# Patient Record
Sex: Male | Born: 1960
Health system: Southern US, Community
[De-identification: ages and names within clinical notes are randomized; demographics above are authoritative.]

## PROBLEM LIST (undated history)

## (undated) DIAGNOSIS — Z8719 Personal history of other diseases of the digestive system: Secondary | ICD-10-CM

## (undated) DIAGNOSIS — K635 Polyp of colon: Secondary | ICD-10-CM

## (undated) DIAGNOSIS — C801 Malignant (primary) neoplasm, unspecified: Secondary | ICD-10-CM

## (undated) DIAGNOSIS — F419 Anxiety disorder, unspecified: Secondary | ICD-10-CM

## (undated) DIAGNOSIS — K219 Gastro-esophageal reflux disease without esophagitis: Secondary | ICD-10-CM

## (undated) DIAGNOSIS — E559 Vitamin D deficiency, unspecified: Secondary | ICD-10-CM

## (undated) DIAGNOSIS — M2550 Pain in unspecified joint: Secondary | ICD-10-CM

## (undated) DIAGNOSIS — J189 Pneumonia, unspecified organism: Secondary | ICD-10-CM

## (undated) DIAGNOSIS — M549 Dorsalgia, unspecified: Secondary | ICD-10-CM

## (undated) HISTORY — DX: Polyp of colon: K63.5

## (undated) HISTORY — DX: Anxiety disorder, unspecified: F41.9

## (undated) HISTORY — DX: Malignant (primary) neoplasm, unspecified: C80.1

## (undated) HISTORY — DX: Pain in unspecified joint: M25.50

## (undated) HISTORY — DX: Vitamin D deficiency, unspecified: E55.9

## (undated) HISTORY — DX: Gastro-esophageal reflux disease without esophagitis: K21.9

## (undated) HISTORY — DX: Dorsalgia, unspecified: M54.9

---

## 1992-11-24 HISTORY — PX: VASECTOMY: SHX75

## 2001-10-29 ENCOUNTER — Encounter (INDEPENDENT_AMBULATORY_CARE_PROVIDER_SITE_OTHER): Payer: Self-pay | Admitting: Specialist

## 2001-10-29 ENCOUNTER — Ambulatory Visit (HOSPITAL_BASED_OUTPATIENT_CLINIC_OR_DEPARTMENT_OTHER): Admission: RE | Admit: 2001-10-29 | Discharge: 2001-10-29 | Payer: Self-pay

## 2004-11-24 DIAGNOSIS — K635 Polyp of colon: Secondary | ICD-10-CM

## 2004-11-24 HISTORY — DX: Polyp of colon: K63.5

## 2005-07-29 ENCOUNTER — Ambulatory Visit: Payer: Self-pay | Admitting: Internal Medicine

## 2005-08-01 ENCOUNTER — Ambulatory Visit: Payer: Self-pay | Admitting: Gastroenterology

## 2005-08-01 ENCOUNTER — Encounter (INDEPENDENT_AMBULATORY_CARE_PROVIDER_SITE_OTHER): Payer: Self-pay | Admitting: Specialist

## 2006-08-28 ENCOUNTER — Ambulatory Visit: Payer: Self-pay | Admitting: Gastroenterology

## 2006-11-24 HISTORY — PX: HERNIA REPAIR: SHX51

## 2007-02-11 ENCOUNTER — Ambulatory Visit (HOSPITAL_COMMUNITY): Admission: RE | Admit: 2007-02-11 | Discharge: 2007-02-12 | Payer: Self-pay | Admitting: Surgery

## 2008-03-15 ENCOUNTER — Ambulatory Visit: Payer: Self-pay | Admitting: Gastroenterology

## 2008-03-30 ENCOUNTER — Encounter: Admission: RE | Admit: 2008-03-30 | Discharge: 2008-03-30 | Payer: Self-pay | Admitting: Surgery

## 2008-07-24 ENCOUNTER — Telehealth: Payer: Self-pay | Admitting: Gastroenterology

## 2010-06-28 ENCOUNTER — Encounter: Payer: Self-pay | Admitting: Gastroenterology

## 2010-11-05 ENCOUNTER — Emergency Department (HOSPITAL_COMMUNITY)
Admission: EM | Admit: 2010-11-05 | Discharge: 2010-11-05 | Payer: Self-pay | Source: Home / Self Care | Admitting: Emergency Medicine

## 2010-12-24 NOTE — Letter (Signed)
Summary: Colonoscopy Letter  [facility]  [address] [place], [place] [address]   Phone: [phone]  Fax: [phone]      June 28, 2010 MRN: [id number]   [facility] [address] [place], [place]  [address]   Dear Mr. GOUGH,   According to your medical record, it is time for you to schedule a Colonoscopy. The American Cancer Society recommends this procedure as a method to detect early colon cancer. Patients with a family history of colon cancer, or a personal history of colon polyps or inflammatory bowel disease are at increased risk.  This letter has been generated based on the recommendations made at the time of your procedure. If you feel that in your particular situation this may no longer apply, please contact our office.  Please call our office at [phone] to schedule this appointment or to update your records at your earliest convenience.  Thank you for cooperating with [place] to provide you with the very best care possible.   Sincerely,  Rachael Fee, M.D.  [facility] [phone]

## 2011-04-08 NOTE — Assessment & Plan Note (Signed)
[facility]                         GASTROENTEROLOGY OFFICE NOTE   DELOSS, AMICO                     MRN:          [id number]  DATE:03/15/2008                            DOB:          29-Mar-1961    GI PROBLEM LIST:  1. Gastroesophageal reflux disease.  Well-controlled when he takes his      PPI daily.  Seems to be sporadic about this.  He has also noticed      alcoholic intake plays a great role.  EGD, September 2006, showed      mild erosive esophagitis LA class A, a 2 cm hiatal hernia,      otherwise normal exam.  No Barrett's esophagus.  2. Family history for colon cancer.  Colonoscopy August 02, 2005,      was normal except for a small rectal hyperplastic polyp.  Next      colonoscopy due September 2011.   INTERVAL HISTORY:  I last saw Avelardo about a year and a half ago.  Since then he has gained about 10 to 11 pounds.  He ran out of his  Protonix several months ago, and since then he has noticed return of his  intermittent pyrosis.  He has no dysphagia, no weight loss, no vomiting  of blood and no otherwise overt GI bleeding.  He is worried that  Protonix may have been contributing to some of his new knee pains and  shoulder pains.   CURRENT MEDICATIONS:  Protonix, which he ran out of several months ago.   PHYSICAL EXAMINATION:  Weight 247 pounds, which is up 11 pounds since  his last visit.  Blood pressure 114/82, pulse 86.  CONSTITUTIONAL:  Generally well appearing.  ABDOMEN:  Soft, nontender, nondistended.  Normal bowel sounds.  EXTREMITIES:  No lower extremity edema.   ASSESSMENT/PLAN:  50 year old man with gastroesophageal reflux  disease.   He has no alarm symptoms, simply ran out of his Protonix.  He is not  sure that he will have prescription coverage soon, and so rather than  call him in a prescription refill for Protonix, I will call a new  prescription for Kapidex, which is a new proton pump inhibitor, that  has  very good drug company benefits these days.  He knows to get back in  touch with me if he has any further questions or concerns.  I also told  him that it was very unlikely that any proton pump inhibitor would be  causing or contributing to his joint pains, and more likely, his steady  weight gain over the past several years is the cause.     Rachael Fee, MD  Electronically Signed    DPJ/MedQ  DD: 03/15/2008  DT: 03/15/2008  Job #: [id number]

## 2011-04-11 NOTE — Op Note (Signed)
[place]  Patient:    Steven Zimmerman, POCH Visit Number: [id number] MRN: [id number]          Service Type: DSU Location: [facility] Attending Physician:  Meredith Leeds Dictated by:   Zigmund Daniel, M.D. Proc. Date: 12/30/01 Admit Date:  10/29/2001 Discharge Date: 10/29/2001                             Operative Report  PREOPERATIVE DIAGNOSIS: 1. Ganglion cyst, left wrist. 2. Subcutaneous tumors of the abdominal wall and back.  POSTOPERATIVE DIAGNOSIS: 1. Ganglion cyst, left wrist. 2. Subcutaneous tumors of the abdominal wall and back.  OPERATION PERFORMED: 1. Excision of ganglion cyst, left wrist. 2. Excision of abdominal wall tumors of abdomen and back (lipomas).  SURGEON:  Zigmund Daniel, M.D.  ANESTHESIA:  General.  DESCRIPTION OF PROCEDURE:  After the patient had adequate general anesthesia and routine preparation and draping of the abdomen and left wrist, I first made a longitudinal incision on the wrist and dissected down to the mass which was cystic and filled with clear fluid.  I dissected on down and found it did not connect with the radial artery, but rather, with the joint and I totally excised it.  I closed the skin with intracuticular 4-0 Vicryl and Steri-Strips after getting good hemostasis.  I then made separate incisions over the subcutaneous mass on the epigastrium and dissected down to it, found it to be a typical lipoma a couple of centimeters in size, and excised it entirely, got hemostasis and did a similar skin closure.  I then turned the patient over prone and excised two lipomas of the back through separate incisions, closing the skin similarly with intracuticular 4-0 Vicryl and Steri-Strips after good hemostasis.  The ganglion cyst was slightly atypical in appearance and I sent it for histological examination.  The lipomas were very typical and soft after removal and they were discarded.  The patient  tolerated the procedure well and woke up without complications. Dictated by:   Zigmund Daniel, M.D. Attending Physician:  Meredith Leeds DD:  12/30/01 TD:  12/31/01 Job: [id number]

## 2011-04-11 NOTE — Op Note (Signed)
Steven Zimmerman, Steven Zimmerman              ACCOUNT NO.:  [id number]   MEDICAL RECORD NO.:  [id number]          PATIENT TYPE:  AMB   LOCATION:  DAY                          FACILITY:  [facility] [place]   PHYSICIAN:  Thomas A. Cornett, M.D.DATE OF BIRTH:  November 08, 1961   DATE OF PROCEDURE:  02/11/2007  DATE OF DISCHARGE:                               OPERATIVE REPORT   PREOPERATIVE DIAGNOSIS:  Periumbilical ventral hernia.   POSTOPERATIVE DIAGNOSIS:  Periumbilical ventral hernia.   PROCEDURE:  Laparoscopic ventral hernia repair with mesh.   SURGEON:  Maisie Fus A. Cornett, M.D.   ANESTHESIA:  General endotracheal anesthesia with approximately 20 mL of  0.25% Sensorcaine local.   ESTIMATED BLOOD LOSS:  20 mL.   SPECIMENS:  None.   INDICATIONS FOR PROCEDURE:  The patient is a 50 year old male who was  sent due to a painful ventral hernia.  He had pain around his  periumbilical region and a bulge there.  He also had some weakness just  to the left of this.  He was brought to the operating room today for  repair of ventral hernia which was symptomatic.  Informed consent was  obtained from the patient.   DESCRIPTION OF PROCEDURE:  The patient was brought to the operating room  and placed supine.  A Foley catheter was placed.  After induction of  appropriate general anesthesia, the abdomen was prepped and draped in  sterile fashion.  He received preop antibiotics.  I elected to use a 5  mm OptiView port to gain access.  A small incision was made in his left  upper quadrant.  With the OptiView under direct vision, I was able to  guide this through the subcu fat, through the muscle layers into the  peritoneal lining under direct vision to enter the abdominal cavity  without difficulty or evidence of injury to bowel.  Insufflation was  obtained to 15 mmHg of CO2.  I was then able to place easily in the  abdominal cavity.  I saw no evidence of any injury to bowel or other  organ from insertion of this trocar.   Next I placed another 5 mm port in  the left lower quadrant.  I placed in the right upper quadrant an 11 mm  port and in the right lower quadrant a 5 mm port, all under direct  vision.  Upon examination of the abdominal cavity, there was a defect at  his umbilicus.  There was also some weakening just left of this, which  was not a full-blown hernia but was quite significant.  There were no  contents caught up in the hernia.  The remainder of his laparoscopy was  normal.  I was able to push down some preperitoneal fat back out of the  hernia and I used the cautery to trim this back and excise it out of the  hernia defects since much of his pain is from this fat bulge.  I used an  Multimedia programmer, though, since this was a significant amount of fatty  tissue that I had to excise from the hernia itself and extracted it  through the right upper quadrant port with an EndoCatch bag.  I then  reinserted this point without difficulty.  Once this was done I measured  out the defect, which in the periumbilical region.  The area I measured  was roughly 8 x 8 cm.  I elected to use 4 cm of overlap and chose a 12 x  12 cm round Parietex dual mesh with the protective coat on the inside.  I then placed four anchoring sutures for this.  This was then soaked in  saline.  This was then inserted through the 11 mm trocar and spread out.  I then used a suture passer to grasp all four sutures and pull these up  to the abdominal wall.  These were tied down.  The superior suture,  though, had broken, and I had to reinsert this using a suture passer  through the mesh and into the abdominal wall and resecured this.  A  ProTack was then used to secure the mesh circumferentially to the  peritoneal lining with no gaps.  It laid with minimal pleating and  covered the defect widely with adequate overlap.  I then reinspected the  abdominal cavity and saw no evidence of bleeding.  I also inspected the  small bowel and colon  as well as liver, stomach and gallbladder, and saw  no evidence of injury or abnormality at this point in time.  I released  the CO2, then reinflated the abdominal cavity and reinspected the  visceral contents and saw no evidence of injury at this point in time.  I then removed the 11-mm port and closed this using a suture passer and  a 0 Vicryl to close the fascial defect.  The remainder of the ports were  then opened to allow the CO2 to escape.  These were all removed then.  I  then closed the skin incisions with 4-0 Monocryl.  I used Dermabond as a  dressing.  All final counts of sponge, needle and instruments were found  to be correct at this portion of the case.  The patient was then awoken,  taken to recovery in satisfactory condition.      Thomas A. Cornett, M.D.  Electronically Signed     TAC/MEDQ  D:  02/11/2007  T:  02/11/2007  Job:  [id number]

## 2011-04-11 NOTE — Assessment & Plan Note (Signed)
[facility]                           GASTROENTEROLOGY OFFICE NOTE   JSAON, YOO                     MRN:          [id number]  DATE:08/28/2006                            DOB:          [birth year]    GI PROBLEM LIST:  1. Gastroesophageal reflux disease.  Well-controlled when he takes his PPI      daily.  Seems to be sporadic about this.  He has also noticed alcoholic      intake plays a great role.  EGD, September 2006, showed mild erosive      esophagitis LA class A, a 2 cm hiatal hernia, otherwise normal exam.      No Barrett's esophagus.  2. Family history for colon cancer.  Colonoscopy August 02, 2005, was      normal except for a small rectal hyperplastic polyp.  Next colonoscopy      due September 2011.   INTERVAL HISTORY:  I last saw Salem about a year ago.  Since then, he has  gained about 15 pounds.  He initially stopped drinking but is back drinking  on kind of a binge basis.  He says a lot of this is because he is having  real family difficulties with a daughter who ran away and has now returned  and is pregnant at a very young age.  He says when stressful times like this  hit, he eats a lot more.  Protonix has been working for him and he is out of  his prescription.   CURRENT MEDICINES:  Protonix daily (he has been out for approximately 1  month).   PHYSICAL EXAM:  Weight 236 pounds.  Blood pressure 132/84.  Pulse 80.  CONSTITUTIONAL:  Obese, otherwise well-appearing.  NEUROLOGIC:  Alert and oriented x3.  ABDOMEN:  Soft, nontender, nondistended.  Normal bowel sounds.   ASSESSMENT AND PLAN:  A 50 year old man with gastroesophageal reflux  disease, family history for colon cancer.  I wrote him a prescription for  Protonix 40 mg to be taken once daily, 20 to 30 minutes prior to a meal.  I  advised him to try taking OTC Prilosec first to see if this helps his  symptoms.  If that does help, he will tear up his Protonix  prescription.  He  knows that many lifestyle factors play in gastroesophageal reflux disease  and will do his best to try to alter those, especially his weight and his  alcohol intake.            ______________________________  Rachael Fee, MD      DPJ/MedQ  DD:  08/28/2006  DT:  08/31/2006  Job #:  [id number]

## 2011-05-22 ENCOUNTER — Encounter: Payer: Self-pay | Admitting: Internal Medicine

## 2011-05-22 ENCOUNTER — Ambulatory Visit (INDEPENDENT_AMBULATORY_CARE_PROVIDER_SITE_OTHER): Payer: Managed Care, Other (non HMO) | Admitting: Internal Medicine

## 2011-05-22 DIAGNOSIS — R490 Dysphonia: Secondary | ICD-10-CM | POA: Insufficient documentation

## 2011-05-22 DIAGNOSIS — F419 Anxiety disorder, unspecified: Secondary | ICD-10-CM

## 2011-05-22 DIAGNOSIS — F411 Generalized anxiety disorder: Secondary | ICD-10-CM

## 2011-05-22 DIAGNOSIS — K219 Gastro-esophageal reflux disease without esophagitis: Secondary | ICD-10-CM | POA: Insufficient documentation

## 2011-05-22 DIAGNOSIS — Z0000 Encounter for general adult medical examination without abnormal findings: Secondary | ICD-10-CM | POA: Insufficient documentation

## 2011-05-22 DIAGNOSIS — N529 Male erectile dysfunction, unspecified: Secondary | ICD-10-CM | POA: Insufficient documentation

## 2011-05-22 LAB — COMPREHENSIVE METABOLIC PANEL
ALT: 25 U/L (ref 0–53)
AST: 20 U/L (ref 0–37)
Albumin: 3.8 g/dL (ref 3.5–5.2)
BUN: 18 mg/dL (ref 6–23)
Chloride: 103 mEq/L (ref 96–112)
Creatinine, Ser: 0.9 mg/dL (ref 0.4–1.5)
Glucose, Bld: 86 mg/dL (ref 70–99)
Total Bilirubin: 0.8 mg/dL (ref 0.3–1.2)
Total Protein: 6.6 g/dL (ref 6.0–8.3)

## 2011-05-22 LAB — CBC WITH DIFFERENTIAL/PLATELET
Basophils Absolute: 0 10*3/uL (ref 0.0–0.1)
Basophils Relative: 0.6 % (ref 0.0–3.0)
Eosinophils Relative: 4.8 % (ref 0.0–5.0)
Hemoglobin: 14.8 g/dL (ref 13.0–17.0)
MCHC: 34.6 g/dL (ref 30.0–36.0)
Monocytes Absolute: 0.6 10*3/uL (ref 0.1–1.0)
Monocytes Relative: 7.8 % (ref 3.0–12.0)
Neutro Abs: 5.1 10*3/uL (ref 1.4–7.7)
Platelets: 171 10*3/uL (ref 150.0–400.0)
WBC: 7.7 10*3/uL (ref 4.5–10.5)

## 2011-05-22 LAB — LIPID PANEL: Total CHOL/HDL Ratio: 4

## 2011-05-22 MED ORDER — PANTOPRAZOLE SODIUM 40 MG PO TBEC: 40.0000 mg | DELAYED_RELEASE_TABLET | Freq: Every day | ORAL | Status: DC

## 2011-05-22 NOTE — Progress Notes (Signed)
  Subjective:    Patient ID: Steven Zimmerman, male    DOB: Sep 22, 1961, 50 y.o.   MRN: [id number]  HPI New patient, has several issues. 2 year history of anxiety, moody, wife believes he needs his testosterone checked.  Weight gain for a while. Few weeks h/o raspy voice Wonders if  he is due for a colonoscopy   Past Medical History  Diagnosis Date  . GERD (gastroesophageal reflux disease)   . Anxiety     Past Surgical History  Procedure Date  . Vasectomy 1994  . Hernia repair 2008    Family History  Problem Relation Age of Onset  . Colon cancer Mother   . Esophageal cancer Father   . Prostate cancer Neg Hx   . Coronary artery disease Neg Hx     History   Social History  . Marital Status: Married    Spouse Name: N/A    Number of Children: 3  . Years of Education: N/A   Occupational History  . 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 Smoking status: Former Games developer  . Smokeless tobacco: Not on file  . Alcohol Use: Yes     no daily but heavy (1 or 2 cases during the weekend)  . Drug Use: Not on file  . Sexually Active: Not on file   Other Topics Concern  . Not on file   Social History Narrative  . No narrative on file      Review of Systems Denies suicidal ideas, never had counseling or take any SSRIs medications. Admits to some problems with difficulty directions, libido is normal. Admits to some stress, works third shift, able to sleep only 4 or 6 hours whenever he leaves work Used to be on protonix, d/c 2 years ago b/c he felt better  better, sx slt worse lately S/p surgery for a hernia repair (umbilical) area still bulge, no pain     Objective:   Physical Exam  Constitutional: He is oriented to person, place, and time. He appears well-developed and well-nourished.  HENT:  Head: Normocephalic and atraumatic.  Neck: No thyromegaly present.  Cardiovascular: Normal rate, regular rhythm and normal heart sounds.   No murmur  heard. Pulmonary/Chest: Breath sounds normal. No respiratory distress. He has no wheezes. He has no rales.  Abdominal: He exhibits no distension. There is no tenderness. There is no rebound.       Soft superficial mass, reducible? Located immediately above  The  umbilicus  Musculoskeletal: He exhibits no edema.  Neurological: He is alert and oriented to person, place, and time.  Psychiatric:       Slightly anxious          Assessment & Plan:

## 2011-05-22 NOTE — Assessment & Plan Note (Addendum)
Patient has  Hoarseness x few weeks, he used to smoke, drinks ETOH. Sx may be due to GERD Refer  to ENT discussed, pt hesitant, will refer if sx persist after GERD treatment

## 2011-05-22 NOTE — Assessment & Plan Note (Addendum)
Due for a physical, today we had to take care of other issues. Will do labs in preparation of CPX. Come back in 4 weeks. Also he has a family history of colon cancer, last colonoscopy 9-9 2006, was due for repeated Cscope in September 2011. Refer to GI.

## 2011-05-22 NOTE — Assessment & Plan Note (Addendum)
Chart reviewed, status post EGD in 2006, had esophagitis, no Barrett's. Recommend to restart PPIs and d/c H2blokers, refer to GI, EGD?

## 2011-05-22 NOTE — Assessment & Plan Note (Addendum)
Request testosterone level checked b/c ED and b/c he is "moody". I agreed to check

## 2011-05-22 NOTE — Assessment & Plan Note (Signed)
pneumonia shot 2008 Td 2003 recommend to continue with her healthy lifestyle Male care per gyn Bone density per gynecology, normal 2005; f/u per gyn  Cscope 2004 ([facility]ICS)  --- f/u per GI , iFOB negative 1-11-----next Cscope 2014

## 2011-05-22 NOTE — Assessment & Plan Note (Addendum)
2 years history of anxiety, moody, no suicidal Also he reports "heavy drinking" during the week and he Plan: I discussed with him counseling and medications. Planning to do labs, if LFTs are okay we'll start an SSRI.  Definitely needs to moderate drinking, pt aware.

## 2011-05-23 LAB — TESTOSTERONE, FREE, TOTAL, SHBG
Testosterone, Free: 79.9 pg/mL (ref 47.0–244.0)
Testosterone: 387.64 ng/dL (ref 250–890)

## 2011-05-26 ENCOUNTER — Telehealth: Payer: Self-pay | Admitting: *Deleted

## 2011-05-26 ENCOUNTER — Encounter: Payer: Self-pay | Admitting: Internal Medicine

## 2011-05-26 MED ORDER — CITALOPRAM HYDROBROMIDE 20 MG PO TABS: ORAL_TABLET | ORAL | Status: DC

## 2011-05-26 NOTE — Telephone Encounter (Signed)
Message copied by Leanne Lovely on Mon May 26, 2011  3:20 PM ------      Message from: Willow Ora E      Created: Mon May 26, 2011  1:13 PM       Advise patient:      labs okay, we'll discuss further when he comes back for his physical.      As we agreed during  the visit, we can start now Citalopram 20 mg ---->half tablet at bedtime for one week, then one tablet at bedtime, call #[phone], no refills.

## 2011-05-26 NOTE — Telephone Encounter (Signed)
Pt is aware.  

## 2011-05-27 ENCOUNTER — Encounter: Payer: Self-pay | Admitting: Gastroenterology

## 2011-06-16 ENCOUNTER — Encounter: Payer: Self-pay | Admitting: Internal Medicine

## 2011-06-16 ENCOUNTER — Ambulatory Visit (INDEPENDENT_AMBULATORY_CARE_PROVIDER_SITE_OTHER): Payer: Managed Care, Other (non HMO) | Admitting: Internal Medicine

## 2011-06-16 DIAGNOSIS — R490 Dysphonia: Secondary | ICD-10-CM

## 2011-06-16 DIAGNOSIS — Z0000 Encounter for general adult medical examination without abnormal findings: Secondary | ICD-10-CM

## 2011-06-16 DIAGNOSIS — K219 Gastro-esophageal reflux disease without esophagitis: Secondary | ICD-10-CM

## 2011-06-16 DIAGNOSIS — F419 Anxiety disorder, unspecified: Secondary | ICD-10-CM

## 2011-06-16 MED ORDER — PANTOPRAZOLE SODIUM 40 MG PO TBEC: 40.0000 mg | DELAYED_RELEASE_TABLET | Freq: Every day | ORAL | Status: DC

## 2011-06-16 MED ORDER — CITALOPRAM HYDROBROMIDE 20 MG PO TABS: ORAL_TABLET | ORAL | Status: DC

## 2011-06-16 NOTE — Assessment & Plan Note (Signed)
Improving after PPI initiation

## 2011-06-16 NOTE — Assessment & Plan Note (Addendum)
Td 2011 Family history of colon cancer, last colonoscopy 9-9 2006, trace hemocult + today, has an appointmet pending w/  GI. Labs discussed,  good results, check a PSA.   diet and exercise discussed

## 2011-06-16 NOTE — Progress Notes (Signed)
  Subjective:    Patient ID: Steven Zimmerman, male    DOB: 07/12/1961, 50 y.o.   MRN: [id number]  HPI Complete physical exam and followup from previous visit  Past Medical History  Diagnosis Date  . GERD (gastroesophageal reflux disease)   . Anxiety    Past Surgical History  Procedure Date  . Vasectomy 1994  . Hernia repair 2008   History   Social History  . Marital Status: Married    Spouse Name: N/A    Number of Children: 3  . Years of Education: N/A   Occupational History  . 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 Smoking status: Former Smoker    Quit date: 03/16/2010  . Smokeless tobacco: Never Used  . Alcohol Use: Yes     no daily but heavy (1 or 2 cases during the weekend)  . Drug Use: No  . Sexually Active: Not on file   Other Topics Concern  . Not on file   Social History Narrative   Diet: regular, problem w/ portion control-----exercise: nothing regular    Family History  Problem Relation Age of Onset  . Colon cancer Mother   . Esophageal cancer Father   . Prostate cancer Neg Hx   . Coronary artery disease Neg Hx      Review of Systems GERD, on PPIs. Feeling slightly better. Hoarseness has decreased Anxiety: Started on citalopram, feels less anxious, sleeping better. Overall improved although recognizes that sometimes medication makes him feel like "he doesn't care". No suicide ideas, no depression. Alcohol, has cut down slightly since her last visit. Denies chest pain, cough, chest congestion. No difficulty urinating or blood in the urine. No nausea, vomiting, diarrhea or blood in the stools.       Objective:   Physical Exam  Constitutional: He is oriented to person, place, and time. He appears well-developed and well-nourished.  HENT:  Head: Normocephalic and atraumatic.  Eyes: No scleral icterus.  Neck: Normal range of motion. Neck supple.  Cardiovascular: Normal rate, regular rhythm and normal heart sounds.   No murmur  heard. Pulmonary/Chest: Effort normal and breath sounds normal. No respiratory distress. He has no wheezes. He has no rales.  Abdominal: Soft. Bowel sounds are normal. He exhibits no distension. There is no rebound.       Again noted a soft midline superficial mass above the umbilicus, likely a  ventral hernia  Genitourinary: Prostate normal. Guaiac positive stool (Hemoccult trace positive).  Musculoskeletal: He exhibits no edema.  Neurological: He is alert and oriented to person, place, and time.  Psychiatric: He has a normal mood and affect. His behavior is normal. Judgment and thought content normal.          Assessment & Plan:

## 2011-06-16 NOTE — Assessment & Plan Note (Signed)
Improving, no change for now, recommend a followup in 3 months

## 2011-06-16 NOTE — Progress Notes (Signed)
  Subjective:    Patient ID: ISSAAC Zimmerman, male    DOB: 11-14-1961, 50 y.o.   MRN: [id number]  HPI    Review of Systems     Objective:   Physical Exam        Assessment & Plan:

## 2011-06-16 NOTE — Assessment & Plan Note (Signed)
On PPIs, to see GI soon

## 2011-07-04 ENCOUNTER — Ambulatory Visit (INDEPENDENT_AMBULATORY_CARE_PROVIDER_SITE_OTHER): Payer: Managed Care, Other (non HMO) | Admitting: Gastroenterology

## 2011-07-04 ENCOUNTER — Encounter: Payer: Self-pay | Admitting: Gastroenterology

## 2011-07-04 DIAGNOSIS — Z809 Family history of malignant neoplasm, unspecified: Secondary | ICD-10-CM

## 2011-07-04 DIAGNOSIS — K219 Gastro-esophageal reflux disease without esophagitis: Secondary | ICD-10-CM

## 2011-07-04 MED ORDER — PEG-KCL-NACL-NASULF-NA ASC-C 100 G PO SOLR: 1.0000 | Freq: Once | ORAL | Status: DC

## 2011-07-04 NOTE — Progress Notes (Signed)
This is a very pleasant 50 year old man whom I last saw in 2006 at that time of an upper and lower endoscopy.  EGD showed mild esophagitis, small hiatal hernia.  Colonoscopy 2006, Delan Ksiazek, single HP.  Since then he has mild intermittent diarrhea. He thinks he had heme positive stool by PCP, but no overt GI bleeding.  He was not having pyrosis for a long time, stopped his PPI several years ago. Restarted PPI for 'raspy voice.' No dypshagia. Was having minor pyrosis after stopping the PPI.    Has gained 5-6 pounds in past 5-6 weeks.  No vomiting.  Dad had esophagus cancer 2002.  Mother had colon cancer.  Has ventral hernia that hurts at times.  Already had a hernia repair once.      Review of systems: Pertinent positive and negative review of systems were noted in the above HPI section.  All other review of systems was otherwise negative.   Past Medical History  Diagnosis Date  . GERD (gastroesophageal reflux disease)   . Anxiety   . Hiatal hernia   . Colon polyps 2006    hyperplastic polyps    Past Surgical History  Procedure Date  . Vasectomy 1994  . Hernia repair 2008     reports that he quit smoking about 15 months ago. He has never used smokeless tobacco. He reports that he drinks alcohol. He reports that he does not use illicit drugs.  family history includes Colon cancer in his mother and Esophageal cancer in his father.  There is no history of Prostate cancer and Coronary artery disease.    Current Medications, Allergies were all reviewed with the patient via [facility] electronic medical record system.    Physical Exam: BP 118/80  Pulse 60  Ht 5\' 10"  (1.778 m)  Wt 246 lb 9.6 oz (111.857 kg)  BMI 35.38 kg/m2 Constitutional: generally well-appearing Psychiatric: alert and oriented x3 Eyes: extraocular movements intact Mouth: oral pharynx moist, no lesions Neck: supple no lymphadenopathy Cardiovascular: heart regular rate and rhythm Lungs: clear to  auscultation bilaterally Abdomen: soft, nontender, nondistended, no obvious ascites, no peritoneal signs, normal bowel sounds, 2-3 cm reducible ventral hernia Extremities: no lower extremity edema bilaterally Skin: no lesions on visible extremities    Assessment and plan: 50 y.o. male with family history of esophageal cancer, family history of colon cancer, recurrent GERD symptoms, ventral hernia  First he knows that if the ventral hernia becomes more bothersome he should contact his general surgeon to consider repair. He is due for colonoscopy for colon cancer screening given his family history of colon cancer (his mother had colon cancer). He also has intermittent GERD symptoms and his father had esophageal cancer, he died from it. We will proceed with EGD at the same time as his colonoscopy.

## 2011-07-04 NOTE — Patient Instructions (Addendum)
Your procedure has been scheduled for 07/24/2011, please follow the seperate instructions.  Your prescription(s) have been sent to you pharmacy.  A copy of this information will be made available to Dr. Drue Novel.

## 2011-07-14 ENCOUNTER — Telehealth: Payer: Self-pay

## 2011-07-14 NOTE — Telephone Encounter (Signed)
Pt is aware of the appt date and time change and has been reinstructed

## 2011-08-21 ENCOUNTER — Ambulatory Visit (HOSPITAL_COMMUNITY)
Admission: RE | Admit: 2011-08-21 | Discharge: 2011-08-21 | Disposition: A | Discharge status: Home / Self Care | Payer: Managed Care, Other (non HMO) | Source: Ambulatory Visit | Attending: Gastroenterology | Admitting: Gastroenterology

## 2011-08-21 ENCOUNTER — Encounter: Payer: Managed Care, Other (non HMO) | Admitting: Gastroenterology

## 2011-08-21 DIAGNOSIS — K921 Melena: Secondary | ICD-10-CM | POA: Insufficient documentation

## 2011-08-21 DIAGNOSIS — Z800 Family history of malignant neoplasm of digestive organs: Secondary | ICD-10-CM | POA: Insufficient documentation

## 2011-08-21 DIAGNOSIS — K219 Gastro-esophageal reflux disease without esophagitis: Secondary | ICD-10-CM | POA: Insufficient documentation

## 2011-08-21 DIAGNOSIS — K439 Ventral hernia without obstruction or gangrene: Secondary | ICD-10-CM | POA: Insufficient documentation

## 2011-08-21 DIAGNOSIS — K449 Diaphragmatic hernia without obstruction or gangrene: Secondary | ICD-10-CM | POA: Insufficient documentation

## 2011-08-21 DIAGNOSIS — F411 Generalized anxiety disorder: Secondary | ICD-10-CM | POA: Insufficient documentation

## 2011-08-21 DIAGNOSIS — D126 Benign neoplasm of colon, unspecified: Secondary | ICD-10-CM | POA: Insufficient documentation

## 2011-10-14 ENCOUNTER — Telehealth: Payer: Self-pay | Admitting: *Deleted

## 2011-10-14 NOTE — Telephone Encounter (Signed)
Agree with your advise

## 2011-10-14 NOTE — Telephone Encounter (Signed)
Pt c/o pain in right shoulder blade extending down arm causing some numbness. Pt also c/o numbness on right side of face since Saturday. Pt advise ED refused stating that he has to go to work and would preferred OV. Pt denies any SOB, or Chest Pain. Pt schedule for OV tomorrow..Please advise

## 2011-10-15 ENCOUNTER — Encounter: Payer: Self-pay | Admitting: Internal Medicine

## 2011-10-15 ENCOUNTER — Ambulatory Visit (INDEPENDENT_AMBULATORY_CARE_PROVIDER_SITE_OTHER): Payer: Managed Care, Other (non HMO) | Admitting: Internal Medicine

## 2011-10-15 ENCOUNTER — Ambulatory Visit (INDEPENDENT_AMBULATORY_CARE_PROVIDER_SITE_OTHER)
Admission: RE | Admit: 2011-10-15 | Discharge: 2011-10-15 | Disposition: A | Discharge status: Home / Self Care | Payer: Managed Care, Other (non HMO) | Source: Ambulatory Visit | Attending: Internal Medicine | Admitting: Internal Medicine

## 2011-10-15 DIAGNOSIS — M5412 Radiculopathy, cervical region: Secondary | ICD-10-CM

## 2011-10-15 DIAGNOSIS — J40 Bronchitis, not specified as acute or chronic: Secondary | ICD-10-CM

## 2011-10-15 MED ORDER — CYCLOBENZAPRINE HCL 5 MG PO TABS: ORAL_TABLET | ORAL | Status: DC

## 2011-10-15 MED ORDER — HYDROCODONE-ACETAMINOPHEN 7.5-500 MG PO TABS: 1.0000 | ORAL_TABLET | Freq: Four times a day (QID) | ORAL | Status: AC | PRN

## 2011-10-15 NOTE — Progress Notes (Signed)
  Subjective:    Patient ID: Steven Zimmerman, male    DOB: 21-Sep-1961, 50 y.o.   MRN: [id number]  HPI BACK  PAIN: Location: R medial  trapezius    Onset: 11/17 as pain upon awakening; ? Related to cough with recent resp tract infection   Severity: up to 9 Pain is described as: sharp  Worse with: movement of RUE ant or posteriorly & with neck rotation to L    Better with: no better with 6-7 NSAIDS  Pain radiates to: no    Impaired range of motion: of neck  due to pain  History of repetitive motion:  no  History of overuse or hyperextension:  no  History of trauma:  no   Past history of similar problem:  no   Symptoms Numbness/tingling:  yes  Into R hand in C8 distribution Weakness:  no  Red Flags Fever:  no  Bowel/bladder dysfunction:  no     Review of Systems   He stopped  the citalopram; he wanted to see how  his anxiety would do off the medication.  He's had a dry cough since Halloween  ; this has improved without intervention. He does smoke intermittently.     Objective:   Physical Exam  Gen. appearance: Well-nourished, in no distress but uncomfortable @ rest. Severe pain with certain movements Eyes: Extraocular motion intact, field of vision normal, vision grossly intact, no nystagmus ENT: Canals clear, tympanic membranes normal,  hearing grossly normal Neck: Decreased range of motion due to pain, no masses, normal thyroid Mouth: upper partial. Hoarse Cardiovascular: Rate and rhythm normal; no murmurs, gallops S4 Lungs: No wheezing, rhonchi or rales present. No increased work of breathing  Muscle skeletal: Range of motion, tone, &  strength normal. No defined weakness was present but he experienced the pain to opposition with arms extended  on the right Neuro:no cranial nerve deficit, deep tendon  reflexes normal, gait normal. Finger-nose testing and Romberg normal Lymph: No cervical or axillary LA Skin: Warm and dry without suspicious lesions or rashes Psych: no  anxiety or mood change. Normally interactive and cooperative.         Assessment & Plan:  #1 C8 radiculopathy on the right  #2 recent bronchitis  Plan: See orders and recommendations.

## 2011-10-15 NOTE — Patient Instructions (Signed)
Order for x-rays entered into  the computer; these will be performed at [address] across from [facility]. No appointment is necessary. 

## 2012-02-25 ENCOUNTER — Other Ambulatory Visit: Payer: Self-pay | Admitting: Internal Medicine

## 2012-02-25 NOTE — Telephone Encounter (Signed)
Caller: Mike/Patient; PCP: Willow Ora; CB#: [phone]; ; ; Call regarding Medication Refill On Protonix and Celexa.; Would like called in to [facility].

## 2012-02-27 ENCOUNTER — Other Ambulatory Visit: Payer: Self-pay | Admitting: Internal Medicine

## 2012-02-27 NOTE — Telephone Encounter (Signed)
Refill done.  

## 2012-03-01 NOTE — Telephone Encounter (Signed)
Tell pt: ok 1 month and 1 RF, no further w/o OV

## 2012-03-02 NOTE — Telephone Encounter (Signed)
Done

## 2012-03-31 ENCOUNTER — Ambulatory Visit (INDEPENDENT_AMBULATORY_CARE_PROVIDER_SITE_OTHER): Payer: Managed Care, Other (non HMO) | Admitting: Family Medicine

## 2012-03-31 ENCOUNTER — Telehealth: Payer: Self-pay | Admitting: Internal Medicine

## 2012-03-31 ENCOUNTER — Encounter: Payer: Self-pay | Admitting: Family Medicine

## 2012-03-31 VITALS — BP 104/68 | HR 74 | Temp 98.0°F | Wt 227.6 lb

## 2012-03-31 DIAGNOSIS — N50819 Testicular pain, unspecified: Secondary | ICD-10-CM

## 2012-03-31 DIAGNOSIS — N509 Disorder of male genital organs, unspecified: Secondary | ICD-10-CM

## 2012-03-31 LAB — POCT URINALYSIS DIPSTICK
Bilirubin, UA: NEGATIVE
Blood, UA: NEGATIVE
Ketones, UA: NEGATIVE
Nitrite, UA: NEGATIVE
Protein, UA: NEGATIVE
Spec Grav, UA: <=1.005
Urobilinogen, UA: 0.2
pH, UA: 7

## 2012-03-31 MED ORDER — LEVOFLOXACIN 500 MG PO TABS
500.0000 mg | ORAL_TABLET | Freq: Every day | ORAL | Status: AC
Start: 2012-03-31 — End: 2012-04-10

## 2012-03-31 NOTE — Telephone Encounter (Signed)
Caller: Steven Zimmerman/Patient; PCP: Willow Ora; CB#: [phone];  Call regarding Testicular Pain not rated by patient.  Onset 03/30/12.  Denies injury. Feels lump "maybe where it attaches".  Tenderness when touched.  Advised see in 4 hours per Scrotum or Testicular Pain protocol.  Home care for the interim and parameters for callback given.  Appt. with Dr. Laury Axon at 1000 as none available with PCP until 14:45.

## 2012-03-31 NOTE — Progress Notes (Signed)
  Subjective:    Patient ID: Steven Zimmerman, male    DOB: Aug 05, 1961, 51 y.o.   MRN: [id number]  HPI Pt here c/o R testicular pain that started last night and was a little worse this am.  No penile d/c.  No back pain, no dysuria.  Pt had an infection in the past and he says it feels like that.  No fever.  Pain is not severe.    Review of Systems as above   Objective:   Physical Exam  Constitutional: He appears well-developed and well-nourished.  Abdominal: Soft. He exhibits no distension and no mass. There is no tenderness. There is no rebound and no guarding.  Genitourinary:       R testicular pain No errythema or swelling  Psychiatric: He has a normal mood and affect. His behavior is normal. Judgment and thought content normal.          Assessment & Plan:  Testicular pain---probably orchitis                 levaquin for 10 days              ua negative                If pain becomes severe ----go to ER immediately                F/u in 2-3 days if symptoms no better---pt wanted to hold off on urology

## 2012-03-31 NOTE — Patient Instructions (Signed)
Orchitis Orchitis is an infection of the testicle of usually sudden onset (happens quickly). It may be viral or bacterial (caused by germs). Usually with this illness there is generalized malaise (not feeling well) and fever. There is also pain. There is usually tenderness and swelling of the scrotum and testicle. DIAGNOSIS  Your caregiver will perform an exam to make sure there is not another reason for the pain in your testicle. A rectal exam may be done to find out if the prostate is swollen and tender. Blood work may be done to see if your white blood cell count is elevated. This can help determine if an infection is viral or bacterial. A urinalysis can also determine what type of infection is present. Most bacterial infections can be treated with antibiotics (medications which kill germs). LET YOUR CAREGIVER KNOW ABOUT:  Allergies.   Medications taken including herbs, eye drops, over the counter medications, and creams.   Use of steroids (by mouth or creams).   Previous problems with anesthetics or novocaine.   Previous prostate infections.   History of blood clots (thrombophlebitis).   History of bleeding or blood problems.   Previous surgery.   Previous urinary tract infection.   Other health problems.  HOME CARE INSTRUCTIONS   Apply cold packs to the scrotal area for twenty minutes, four times per day or as needed.   A scrotal support may be helpful. Keep a small pillow or support under your testicles while lying or sitting down.   Only take over-the-counter or prescription medicines for pain, discomfort, or fever as directed by your caregiver.   Take all medications, including antibiotics, as directed. Take the antibiotics for the full prescribed length of time even if you are feeling better.  SEEK IMMEDIATE MEDICAL CARE IF:   Your redness, swelling, or pain in the testicle increases or is not getting better.   You have a fever.   You have pain not relieved with  medicines.   You have any worsening of any symptoms (problems) that originally brought you in for medical care.  Document Released: 11/07/2000 Document Revised: 10/30/2011 Document Reviewed: 11/10/2005 Exit[employer] Patient Information 2012 [employer]. 

## 2012-03-31 NOTE — Telephone Encounter (Signed)
Saw Dr Elbert Ewings

## 2014-01-05 ENCOUNTER — Ambulatory Visit: Payer: Managed Care, Other (non HMO)

## 2014-01-05 ENCOUNTER — Ambulatory Visit (INDEPENDENT_AMBULATORY_CARE_PROVIDER_SITE_OTHER): Payer: Managed Care, Other (non HMO) | Admitting: Family Medicine

## 2014-01-05 VITALS — BP 130/88 | HR 70 | Temp 98.0°F | Resp 16 | Ht 70.0 in | Wt 249.0 lb

## 2014-01-05 DIAGNOSIS — S93401A Sprain of unspecified ligament of right ankle, initial encounter: Secondary | ICD-10-CM

## 2014-01-05 DIAGNOSIS — M79671 Pain in right foot: Secondary | ICD-10-CM

## 2014-01-05 DIAGNOSIS — M79609 Pain in unspecified limb: Secondary | ICD-10-CM

## 2014-01-05 DIAGNOSIS — S93409A Sprain of unspecified ligament of unspecified ankle, initial encounter: Secondary | ICD-10-CM

## 2014-01-05 MED ORDER — TRAMADOL HCL 50 MG PO TABS: 50.0000 mg | ORAL_TABLET | Freq: Three times a day (TID) | ORAL | Status: DC | PRN

## 2014-01-05 NOTE — Progress Notes (Signed)
[facility] [address], [place] [address] [phone]  Date:  01/05/2014   Name:  Steven Zimmerman   DOB:  10/14/1961   MRN:  [id number]  PCP:  Kathlene November, MD    Chief Complaint: Ankle Pain   History of Present Illness:  Steven Zimmerman is a 53 y.o. very pleasant male patient who presents with the following:  Today is Thursday. Sine Sunday he has noted some pain in right foot and ankle.  He has tried ibuprofen.  Saturday night they were "out drinking and dancing" and he is not sure if he might have injured his ankle he really does not remember any injury and was able to walk the next day.  They went out riding motorcycles the next day and felt ok, his ankle hurt some. However over the last several days standing on his feet all day at work has caused pain and some swelling.  He has never had gout.   He otherwise feels well and does not have any other injury.    He has tried some aleve which was somewhat helpful   Patient Active Problem List   Diagnosis Date Noted  . Erectile dysfunction 05/22/2011  . Hoarseness 05/22/2011  . GERD (gastroesophageal reflux disease)   . Anxiety     Past Medical History  Diagnosis Date  . GERD (gastroesophageal reflux disease)   . Anxiety   . Hiatal hernia   . Colon polyps 2006    hyperplastic polyps    Past Surgical History  Procedure Laterality Date  . Vasectomy  1994  . Hernia repair  2008    History  Substance Use Topics  . Smoking status: Former Smoker    Quit date: 03/16/2010  . Smokeless tobacco: Never Used  . Alcohol Use: Yes     Comment: no daily but heavy (1 or 2 cases during the weekend)    Family History  Problem Relation Age of Onset  . Colon cancer Mother   . Esophageal cancer Father   . Prostate cancer Neg Hx   . Coronary artery disease Neg Hx     No Known Allergies  Medication list has been reviewed and updated.  Current Outpatient Prescriptions on File Prior to Visit  Medication  Sig Dispense Refill  . citalopram (CELEXA) 20 MG tablet take 1/2 tablet by mouth at bedtime for 1 WEEK, THEN 1 TABLET BY MOUTH AT BEDTIME THEREAFTER.  30 tablet  3  . cyclobenzaprine (FLEXERIL) 5 MG tablet 1 every 8 hrs during  day and 1-2 @ bedtime as needed  30 tablet  0  . multivitamin (THERAGRAN) per tablet Take 1 tablet by mouth daily.        . pantoprazole (PROTONIX) 40 MG tablet take 1 tablet by mouth once daily  30 tablet  3   No current facility-administered medications on file prior to visit.    Review of Systems:  As per HPI- otherwise negative.   Physical Examination: Filed Vitals:   01/05/14 1006  BP: 130/88  Pulse: 70  Temp: 98 F (36.7 C)  Resp: 16   Filed Vitals:   01/05/14 1006  Height: 5\' 10"  (1.778 m)  Weight: 249 lb (112.946 kg)   Body mass index is 35.73 kg/(m^2). Ideal Body Weight: Weight in (lb) to have BMI = 25: 173.9  GEN: WDWN, NAD, Non-toxic, A & O x 3, muscular build, looks well HEENT: Atraumatic, Normocephalic. Neck supple. No masses, No LAD. Ears  and Nose: No external deformity. CV: RRR, No M/G/R. No JVD. No thrill. No extra heart sounds. PULM: CTA B, no wheezes, crackles, rhonchi. No retractions. No resp. distress. No accessory muscle use. EXTR: No c/c/e NEURO Normal gait.  Does not visibly favor ankle PSYCH: Normally interactive. Conversant. Not depressed or anxious appearing.  Calm demeanor.  Right ankle: he is slighlty tender over the medial malleolus.  Minimal to no swelling. Achilles intact.  Negative foot squeeze.  Positive talar tilt. Normal flextion and extension of the ankle.  No bruise, heat or redness  [facility] reading (PRIMARY) by  Dr. Lorelei Pont. Right ankle:  negative Right foot:  Negative RIGHT FOOT COMPLETE - 3+ VIEW  COMPARISON: None.  FINDINGS: Three views of the right foot submitted. No acute fracture or subluxation. Small plantar spur of calcaneus.  IMPRESSION: No acute fracture or subluxation. Small plantar spur of  calcaneus.  RIGHT ANKLE - COMPLETE 3+ VIEW  COMPARISON: None.  FINDINGS: Three views of the right ankle submitted. No acute fracture or subluxation. Ankle mortise is preserved.  IMPRESSION: Negative.   aircast was not helpful for him.  Also felt uncomfortable in CAM boot- feels best in his regular shoes.    Assessment and Plan: Sprain of right ankle - Plan: DG Ankle Complete Right  Pain in right foot - Plan: DG Foot Complete Right, traMADol (ULTRAM) 50 MG tablet  Crutches to use prn, ice, elevate, note for work.  Tramadol as needed- cautioned regarding sedation.   Let me know if not better in the next few days- Sooner if worse.   Note for work to be out until [place], MD

## 2014-01-05 NOTE — Patient Instructions (Signed)
Use your crutches as needed to take weight off of your foot.  Use the tramadol as needed for more severe pain- remember this can make you drowsy.  Ice and elevate your foot and ankle when you can. If you are not better in the next few days please let me know- Sooner if worse.

## 2014-07-24 ENCOUNTER — Encounter: Payer: Self-pay | Admitting: Medical

## 2014-07-24 ENCOUNTER — Ambulatory Visit (INDEPENDENT_AMBULATORY_CARE_PROVIDER_SITE_OTHER): Payer: Managed Care, Other (non HMO) | Admitting: Medical

## 2014-07-24 VITALS — BP 122/80 | HR 83 | Temp 98.3°F | Ht 69.2 in | Wt 254.0 lb

## 2014-07-24 DIAGNOSIS — K439 Ventral hernia without obstruction or gangrene: Secondary | ICD-10-CM

## 2014-07-24 DIAGNOSIS — K429 Umbilical hernia without obstruction or gangrene: Secondary | ICD-10-CM

## 2014-07-24 NOTE — Assessment & Plan Note (Signed)
Actually, this is not appearing as umbilical but more ventral/midline above the umbilicus. So I will refer to surgeon for evaluation. During the interim if he has worsening signs or symptoms asked him to notify us. If after hours if symptoms change dramatically then advised to go to the emergency departmen

## 2014-07-24 NOTE — Patient Instructions (Addendum)
I am going to refer you to local surgeon for your abdominal bulge which could be hernia. There may be breakdown of prior mesh with resulting recurrent hernia. Prior to referral if you were to have any  worsening abdominal  pain, fever, chills, nausea or vomitng notify us/return for office vist.If  severe pain/symptoms after hours  then ED evaluation. We will call you with referral.

## 2014-07-24 NOTE — Progress Notes (Signed)
   Subjective:    Patient ID: Steven Zimmerman, male    DOB: [birth year], 53 y.o.   MRN: [id number]  HPI  Pt has history of umbilical hernia. Repair/ mesh placed about 8 years with surgeon. Pt not sure name of surgeon. Pt states he feels that area has gradually gotten larger over last few years. Feels larger when he coughs. He gets occasional random sharp pain when he lifts things. Also larger when he lifts things.   Pt states that he had a followup with prior surgeon years ago for follow up due to mild buldge about 1 yr post surgery. He thinks they did mri but no abnormality noted that required surgery.  Pt feels like over last 2-3 yrs area is gradually growing.       Review of Systems  Constitutional: Negative for fever, chills and fatigue.  Respiratory: Negative for choking, chest tightness and wheezing.   Cardiovascular: Negative for chest pain and palpitations.  Gastrointestinal: Positive for nausea. Negative for vomiting, abdominal pain, diarrhea, constipation, blood in stool, abdominal distention and anal bleeding.       Buldging of region above umbilicus.  He has not had any nausea today but reports one day past week felt minimal nausea and she thought was related to the bulge.  For the most part he just states that the size of the area is a concern.  Genitourinary: Negative.   Neurological: Negative for syncope and headaches.         Objective:   Physical Exam  General-no acute distress, pleasant patient. Lungs-clear even and unlabored bilaterally. Heart-regular rate and rhythm. Abdomen-large protuberant abdomen overall. On straight leg lifts he does have a prominent bulge midline above his umbilicus. On auscultation he has no bruits.         Assessment & Plan:

## 2014-07-24 NOTE — Assessment & Plan Note (Deleted)
t

## 2016-06-16 ENCOUNTER — Encounter: Payer: Self-pay | Admitting: Gastroenterology

## 2016-06-16 ENCOUNTER — Encounter: Payer: Self-pay | Admitting: Internal Medicine

## 2016-08-18 ENCOUNTER — Encounter: Payer: Self-pay | Admitting: Gastroenterology

## 2016-10-20 ENCOUNTER — Telehealth: Payer: Self-pay | Admitting: *Deleted

## 2016-10-20 ENCOUNTER — Ambulatory Visit (AMBULATORY_SURGERY_CENTER): Payer: Self-pay | Admitting: *Deleted

## 2016-10-20 VITALS — Ht 70.0 in | Wt 263.0 lb

## 2016-10-20 DIAGNOSIS — Z800 Family history of malignant neoplasm of digestive organs: Secondary | ICD-10-CM

## 2016-10-20 MED ORDER — NA SULFATE-K SULFATE-MG SULF 17.5-3.13-1.6 GM/177ML PO SOLN: 1.0000 | Freq: Once | ORAL | 0 refills | Status: AC

## 2016-10-20 NOTE — Telephone Encounter (Signed)
Please cancel his upcoming colonoscopy. He should get in with his surgeon about the symptomatic abdominal hernia that may require another repair.  After that evaluation we can go ahead with colonoscopy if surgery is not planned. Please ask him to call us to reschedule the colonoscopy.  There is no screening for esophageal cancer for FH of esophageal cancer. He does not need EGD.

## 2016-10-20 NOTE — Telephone Encounter (Signed)
Called pt, advised pt Dr Ardis Hughs wants to cxl colonoscopy until after he has seen the surgeon about his hernia, if it does not warrant surgery at that time, he is ok with doing the colonoscopy, if surgery he will need to wait, also adv pt he did not need EGD for family hx, pt verbalized understanding, will cxl colonoscopy at this time, pt will call back once he has seen the surgeon and is cleared-adm

## 2016-10-20 NOTE — Progress Notes (Signed)
No egg or soy allergy. No anesthesia problems.  No home O2.  No diet meds.  

## 2016-10-20 NOTE — Telephone Encounter (Signed)
1.Pt was seen today in pre-visit for colonoscopy scheduled on 11/03/16, pt states he usually has colonoscopy and EGD scheduled together because of family hx of colon ca, and family hx of esophageal ca, pt is wondering if he needs the EGD at this time as well(it was not currently scheduled). 2. Pt has umbilical hernia that he has had repaired twice, and the hernia has started to come back in the last 6 mths per pt,  pt does have pain occasionally with redness around site and has an increase in pain over the last couple of weeks. Is pt ok for colonoscopy? If so, do you want EGD and colonoscopy scheduled together? pls adv-adm

## 2016-10-23 ENCOUNTER — Encounter: Payer: Self-pay | Admitting: Gastroenterology

## 2016-10-31 ENCOUNTER — Telehealth: Payer: Self-pay | Admitting: Gastroenterology

## 2016-10-31 NOTE — Telephone Encounter (Signed)
Ok, please cancel the colonoscopy and let him know to call when he is ready to reschedule it.

## 2016-11-03 ENCOUNTER — Encounter: Payer: Managed Care, Other (non HMO) | Admitting: Gastroenterology

## 2016-12-01 ENCOUNTER — Telehealth: Payer: Self-pay | Admitting: Gastroenterology

## 2016-12-01 ENCOUNTER — Ambulatory Visit: Payer: Self-pay | Admitting: General Surgery

## 2016-12-01 NOTE — H&P (Signed)
History of Present Illness Ralene Ok MD; 12/01/2016 4:05 PM) The patient is a 56 year old male who presents with an incisional hernia. 56 year old male who comes in today secondary to a recurrent epigastric bulge. Patient had surgery in December 2015. Patient a laparoscopic ventral hernia repair which was a recurrence from her previous umbilical repair.  Patient states that the bulge has gotten bigger. He states he does not have any pain at this time. 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 He does state that he does do some heavy lifting. He noticed no particular time when he noticed a bulge and pain.  Patient is due for a colonoscopy by Dr. Ardis Hughs. From my standpoint it is okay to proceed with colonoscopy prior to any definitive hernia surgery.   Allergies Malachy Moan, [place]; 12/01/2016 3:38 PM) No Known Drug Allergies 08/14/2014  Medication History Malachy Moan, [place]; 12/01/2016 3:40 PM) Ibuprofen (200MG  Tablet, Oral d3) Active. No Current Medications (Taken starting 12/01/2016) [name] (Oral daily) Active. Protonix (40MG  Tablet DR, Oral daily) Active. RaNITidine HCl (75MG  Tablet, Oral daily) Active. Percocet (5-325MG  Tablet, 1-2 Tablet Oral q 4-6 hours, Taken starting 11/10/2014) Active. Medications Reconciled  Vitals Malachy Moan RMA; 12/01/2016 3:40 PM) 12/01/2016 3:40 PM Weight: 263.6 lb Height: 70in Body Surface Area: 2.35 m Body Mass Index: 37.82 kg/m  Temp.: 98.12F  BP: 118/84 (Sitting, Left Arm, Standard)       Physical Exam Ralene Ok MD; 12/01/2016 4:06 PM) General Mental Status-Alert. General Appearance-Consistent with stated age. Hydration-Well hydrated. Voice-Normal.  Chest and Lung Exam Chest and lung exam reveals -quiet, even and easy respiratory effort with no use of accessory muscles and on auscultation, normal breath sounds, no adventitious sounds and normal vocal  resonance. Inspection Chest Wall - Normal. Back - normal.  Cardiovascular Cardiovascular examination reveals -normal heart sounds, regular rate and rhythm with no murmurs and normal pedal pulses bilaterally.  Abdomen Note: Supraumbilical recurrent hernia. Defect appears to be 6 x 8 cm.     Assessment & Plan Ralene Ok MD; 12/01/2016 4:07 PM) RECURRENT VENTRAL HERNIA (K43.2) Impression: 56 year old male with a recurrent ventral hernia. 1. I'll discuss Dr. Ardis Hughs it is okay to proceed with colonoscopy prior to definitive hernia surgery 2. Discussed with the patient will likely require open ventral hernia repair with mesh. This will likely require either a retrorectus orTAR hernia repair. 2. Discussed with the patient the risks and benefits of the procedure to include but not limited to: Infection, bleeding, damage to structures, possibility of for further surgery, possible recurrence. The patient was understanding and wished to proceed.

## 2016-12-02 MED ORDER — NA SULFATE-K SULFATE-MG SULF 17.5-3.13-1.6 GM/177ML PO SOLN: ORAL | 0 refills | Status: DC

## 2016-12-02 NOTE — Telephone Encounter (Signed)
See phone note from dr. Rosendo Gros in the chart with clearance. New instructions mailed.  Rx sent. Patient notified

## 2016-12-22 ENCOUNTER — Encounter: Payer: Self-pay | Admitting: Gastroenterology

## 2016-12-22 ENCOUNTER — Ambulatory Visit (AMBULATORY_SURGERY_CENTER): Payer: Managed Care, Other (non HMO) | Admitting: Gastroenterology

## 2016-12-22 VITALS — BP 91/62 | HR 62 | Temp 96.2°F | Resp 10 | Ht 70.0 in | Wt 155.0 lb

## 2016-12-22 DIAGNOSIS — D124 Benign neoplasm of descending colon: Secondary | ICD-10-CM

## 2016-12-22 DIAGNOSIS — K635 Polyp of colon: Secondary | ICD-10-CM | POA: Diagnosis not present

## 2016-12-22 DIAGNOSIS — Z1211 Encounter for screening for malignant neoplasm of colon: Secondary | ICD-10-CM | POA: Diagnosis present

## 2016-12-22 DIAGNOSIS — Z800 Family history of malignant neoplasm of digestive organs: Secondary | ICD-10-CM | POA: Diagnosis not present

## 2016-12-22 DIAGNOSIS — Z1212 Encounter for screening for malignant neoplasm of rectum: Secondary | ICD-10-CM | POA: Diagnosis not present

## 2016-12-22 DIAGNOSIS — D128 Benign neoplasm of rectum: Secondary | ICD-10-CM | POA: Diagnosis not present

## 2016-12-22 DIAGNOSIS — D126 Benign neoplasm of colon, unspecified: Secondary | ICD-10-CM

## 2016-12-22 DIAGNOSIS — D125 Benign neoplasm of sigmoid colon: Secondary | ICD-10-CM

## 2016-12-22 DIAGNOSIS — K621 Rectal polyp: Secondary | ICD-10-CM

## 2016-12-22 MED ORDER — SODIUM CHLORIDE 0.9 % IV SOLN: 500.0000 mL | INTRAVENOUS | Status: DC

## 2016-12-22 NOTE — Op Note (Signed)
[facility] Patient Name: Steven Zimmerman Procedure Date: 12/22/2016 11:01 AM MRN: [id number] Endoscopist: Milus Banister , MD Age: 56 Referring MD:  Date of Birth: 1961-09-22 Gender: Male Account #: [id number] Procedure:                Colonoscopy Indications:              Screening in patient at increased risk: Family                            history of 1st-degree relative with colorectal                            cancer before age 15 years (mother diagnosed in her                            66s) Medicines:                Monitored Anesthesia Care Procedure:                Pre-Anesthesia Assessment:                           - Prior to the procedure, a History and Physical                            was performed, and patient medications and                            allergies were reviewed. The patient's tolerance of                            previous anesthesia was also reviewed. The risks                            and benefits of the procedure and the sedation                            options and risks were discussed with the patient.                            All questions were answered, and informed consent                            was obtained. Prior Anticoagulants: The patient has                            taken no previous anticoagulant or antiplatelet                            agents. ASA Grade Assessment: II - A patient with                            mild systemic disease. After reviewing the risks  and benefits, the patient was deemed in                            satisfactory condition to undergo the procedure.                           After obtaining informed consent, the colonoscope                            was passed under direct vision. Throughout the                            procedure, the patient's blood pressure, pulse, and                            oxygen saturations were monitored continuously. The                     Model CF-HQ190L [id number]) scope was introduced                            through the anus and advanced to the the cecum,                            identified by appendiceal orifice and ileocecal                            valve. The colonoscopy was performed without                            difficulty. The patient tolerated the procedure                            well. The quality of the bowel preparation was                            excellent. The ileocecal valve, appendiceal                            orifice, and rectum were photographed. Scope In: 11:08:26 AM Scope Out: 11:20:59 AM Scope Withdrawal Time: 0 hours 10 minutes 57 seconds  Total Procedure Duration: 0 hours 12 minutes 33 seconds  Findings:                 Four sessile polyps were found in the rectum,                            sigmoid colon and descending colon. The polyps were                            3 to 5 mm in size. These polyps were removed with a                            cold snare. Resection and retrieval were complete.  The exam was otherwise without abnormality on                            direct and retroflexion views. Complications:            No immediate complications. Estimated blood loss:                            None. Estimated Blood Loss:     Estimated blood loss: none. Impression:               - Four 3 to 5 mm polyps in the rectum, in the                            sigmoid colon and in the descending colon, removed                            with a cold snare. Resected and retrieved.                           - The examination was otherwise normal on direct                            and retroflexion views. Recommendation:           - Patient has a contact number available for                            emergencies. The signs and symptoms of potential                            delayed complications were discussed with the                             patient. Return to normal activities tomorrow.                            Written discharge instructions were provided to the                            patient.                           - Resume previous diet.                           - Continue present medications.                           You will receive a letter within 2-3 weeks with the                            pathology results and my final recommendations.                           If the polyp(s) is proven to be 'pre-cancerous'  on                            pathology, you will need repeat colonoscopy in 3-5                            years. Milus Banister, MD 12/22/2016 11:23:48 AM This report has been signed electronically.

## 2016-12-22 NOTE — Progress Notes (Signed)
Called to room to assist during endoscopic procedure.  Patient ID and intended procedure confirmed with present staff. Received instructions for my participation in the procedure from the performing physician.  

## 2016-12-22 NOTE — Progress Notes (Signed)
Report given to PACU RN, vss 

## 2016-12-22 NOTE — Patient Instructions (Signed)
Discharge instructions given. Handouts on polyps. Resume previous medications. YOU HAD AN ENDOSCOPIC PROCEDURE TODAY AT THE [facility]:   Refer to the procedure report that was given to you for any specific questions about what was found during the examination.  If the procedure report does not answer your questions, please call your gastroenterologist to clarify.  If you requested that your care partner not be given the details of your procedure findings, then the procedure report has been included in a sealed envelope for you to review at your convenience later.  YOU SHOULD EXPECT: Some feelings of bloating in the abdomen. Passage of more gas than usual.  Walking can help get rid of the air that was put into your GI tract during the procedure and reduce the bloating. If you had a lower endoscopy (such as a colonoscopy or flexible sigmoidoscopy) you may notice spotting of blood in your stool or on the toilet paper. If you underwent a bowel prep for your procedure, you may not have a normal bowel movement for a few days.  Please Note:  You might notice some irritation and congestion in your nose or some drainage.  This is from the oxygen used during your procedure.  There is no need for concern and it should clear up in a day or so.  SYMPTOMS TO REPORT IMMEDIATELY:   Following lower endoscopy (colonoscopy or flexible sigmoidoscopy):  Excessive amounts of blood in the stool  Significant tenderness or worsening of abdominal pains  Swelling of the abdomen that is new, acute  Fever of 100F or higher   For urgent or emergent issues, a gastroenterologist can be reached at any hour by calling [phone].   DIET:  We do recommend a small meal at first, but then you may proceed to your regular diet.  Drink plenty of fluids but you should avoid alcoholic beverages for 24 hours.  ACTIVITY:  You should plan to take it easy for the rest of today and you should NOT DRIVE or use heavy  machinery until tomorrow (because of the sedation medicines used during the test).    FOLLOW UP: Our staff will call the number listed on your records the next business day following your procedure to check on you and address any questions or concerns that you may have regarding the information given to you following your procedure. If we do not reach you, we will leave a message.  However, if you are feeling well and you are not experiencing any problems, there is no need to return our call.  We will assume that you have returned to your regular daily activities without incident.  If any biopsies were taken you will be contacted by phone or by letter within the next 1-3 weeks.  Please call us at [phone] if you have not heard about the biopsies in 3 weeks.    SIGNATURES/CONFIDENTIALITY: You and/or your care partner have signed paperwork which will be entered into your electronic medical record.  These signatures attest to the fact that that the information above on your After Visit Summary has been reviewed and is understood.  Full responsibility of the confidentiality of this discharge information lies with you and/or your care-partner. 

## 2016-12-23 ENCOUNTER — Telehealth: Payer: Self-pay

## 2016-12-23 NOTE — Telephone Encounter (Signed)
  Follow up Call-  Call back number 12/22/2016  Post procedure Call Back phone  # [phone]  Permission to leave phone message Yes  Some recent data might be hidden    Patient was called for follow up after his procedure on 12/22/2016. No answer at the number given for follow up phone call. A message was left on the answering machine.

## 2016-12-23 NOTE — Telephone Encounter (Signed)
  Follow up Call-  Call back number 12/22/2016  Post procedure Call Back phone  # [phone]  Permission to leave phone message Yes  Some recent data might be hidden     Patient questions:  Do you have a fever, pain , or abdominal swelling? No. Pain Score  0 *  Have you tolerated food without any problems? Yes.    Have you been able to return to your normal activities? Yes.    Do you have any questions about your discharge instructions: Diet   No. Medications  No. Follow up visit  No.  Do you have questions or concerns about your Care? No.  Actions: * If pain score is 4 or above: No action needed, pain <4.

## 2016-12-26 ENCOUNTER — Encounter: Payer: Self-pay | Admitting: Gastroenterology

## 2017-01-08 NOTE — Patient Instructions (Signed)
Steven Zimmerman  01/08/2017   Your procedure is scheduled on: 01/15/17  Report to [facility] Main  Entrance take [place]  elevators to 3rd floor to  [place] at      0700 AM.  Call this number if you have problems the morning of surgery [phone]   Remember: ONLY 1 PERSON MAY GO WITH YOU TO SHORT STAY TO GET  READY MORNING OF [place].  Do not eat food or drink liquids :After Midnight.     Take these medicines the morning of surgery with A SIP OF WATER: Zantac                                You may not have any metal on your body including hair pins and              piercings  Do not wear jewelry,  lotions, powders or perfumes, deodorant                        Men may shave face and neck.   Do not bring valuables to the hospital. [place].  Contacts, dentures or bridgework may not be worn into surgery.  Leave suitcase in the car. After surgery it may be brought to your room.                 Please read over the following fact sheets you were given: _____________________________________________________________________             [facility] - Preparing for Surgery Before surgery, you can play an important role.  Because skin is not sterile, your skin needs to be as free of germs as possible.  You can reduce the number of germs on your skin by washing with CHG (chlorahexidine gluconate) soap before surgery.  CHG is an antiseptic cleaner which kills germs and bonds with the skin to continue killing germs even after washing. Please DO NOT use if you have an allergy to CHG or antibacterial soaps.  If your skin becomes reddened/irritated stop using the CHG and inform your nurse when you arrive at Short Stay. Do not shave (including legs and underarms) for at least 48 hours prior to the first CHG shower.  You may shave your face/neck. Please follow these instructions carefully:  1.  Shower with CHG  Soap the night before surgery and the  morning of Surgery.  2.  If you choose to wash your hair, wash your hair first as usual with your  normal  shampoo.  3.  After you shampoo, rinse your hair and body thoroughly to remove the  shampoo.                           4.  Use CHG as you would any other liquid soap.  You can apply chg directly  to the skin and wash                       Gently with a scrungie or clean washcloth.  5.  Apply the CHG Soap to your body ONLY FROM THE NECK DOWN.   Do not use  on face/ open                           Wound or open sores. Avoid contact with eyes, ears mouth and genitals (private parts).                       Wash face,  Genitals (private parts) with your normal soap.             6.  Wash thoroughly, paying special attention to the area where your surgery  will be performed.  7.  Thoroughly rinse your body with warm water from the neck down.  8.  DO NOT shower/wash with your normal soap after using and rinsing off  the CHG Soap.                9.  Pat yourself dry with a clean towel.            10.  Wear clean pajamas.            11.  Place clean sheets on your bed the night of your first shower and do not  sleep with pets. Day of Surgery : Do not apply any lotions/deodorants the morning of surgery.  Please wear clean clothes to the hospital/surgery center.  FAILURE TO FOLLOW THESE INSTRUCTIONS MAY RESULT IN THE CANCELLATION OF YOUR SURGERY PATIENT SIGNATURE_________________________________  NURSE SIGNATURE__________________________________  ________________________________________________________________________

## 2017-01-09 ENCOUNTER — Inpatient Hospital Stay (HOSPITAL_COMMUNITY): Admission: RE | Admit: 2017-01-09 | Payer: Managed Care, Other (non HMO) | Source: Ambulatory Visit

## 2017-01-12 ENCOUNTER — Other Ambulatory Visit (HOSPITAL_COMMUNITY): Payer: Managed Care, Other (non HMO)

## 2017-01-12 ENCOUNTER — Encounter (HOSPITAL_COMMUNITY)
Admission: RE | Admit: 2017-01-12 | Discharge: 2017-01-12 | Disposition: A | Discharge status: Home / Self Care | Payer: Managed Care, Other (non HMO) | Source: Ambulatory Visit | Attending: General Surgery | Admitting: General Surgery

## 2017-01-12 ENCOUNTER — Encounter (HOSPITAL_COMMUNITY): Payer: Self-pay

## 2017-01-12 DIAGNOSIS — F17210 Nicotine dependence, cigarettes, uncomplicated: Secondary | ICD-10-CM | POA: Diagnosis not present

## 2017-01-12 DIAGNOSIS — Z01818 Encounter for other preprocedural examination: Secondary | ICD-10-CM | POA: Insufficient documentation

## 2017-01-12 DIAGNOSIS — K432 Incisional hernia without obstruction or gangrene: Secondary | ICD-10-CM | POA: Insufficient documentation

## 2017-01-12 DIAGNOSIS — K219 Gastro-esophageal reflux disease without esophagitis: Secondary | ICD-10-CM | POA: Diagnosis not present

## 2017-01-12 DIAGNOSIS — K439 Ventral hernia without obstruction or gangrene: Secondary | ICD-10-CM | POA: Diagnosis not present

## 2017-01-12 DIAGNOSIS — F419 Anxiety disorder, unspecified: Secondary | ICD-10-CM | POA: Diagnosis not present

## 2017-01-12 DIAGNOSIS — Z6837 Body mass index (BMI) 37.0-37.9, adult: Secondary | ICD-10-CM | POA: Diagnosis not present

## 2017-01-12 DIAGNOSIS — Z79899 Other long term (current) drug therapy: Secondary | ICD-10-CM | POA: Diagnosis not present

## 2017-01-12 DIAGNOSIS — Z791 Long term (current) use of non-steroidal anti-inflammatories (NSAID): Secondary | ICD-10-CM | POA: Diagnosis not present

## 2017-01-12 DIAGNOSIS — J449 Chronic obstructive pulmonary disease, unspecified: Secondary | ICD-10-CM | POA: Diagnosis not present

## 2017-01-12 HISTORY — DX: Personal history of other diseases of the digestive system: Z87.19

## 2017-01-12 HISTORY — DX: Pneumonia, unspecified organism: J18.9

## 2017-01-12 LAB — CBC
HEMATOCRIT: 45.9 % (ref 39.0–52.0)
Hemoglobin: 15.3 g/dL (ref 13.0–17.0)
MCH: 30 pg (ref 26.0–34.0)
MCHC: 33.3 g/dL (ref 30.0–36.0)
MCV: 90 fL (ref 78.0–100.0)
PLATELETS: 208 10*3/uL (ref 150–400)
RBC: 5.1 MIL/uL (ref 4.22–5.81)
RDW: 13.6 % (ref 11.5–15.5)
WBC: 7.1 10*3/uL (ref 4.0–10.5)

## 2017-01-15 ENCOUNTER — Encounter (HOSPITAL_COMMUNITY): Payer: Self-pay

## 2017-01-15 ENCOUNTER — Inpatient Hospital Stay (HOSPITAL_COMMUNITY): Payer: Managed Care, Other (non HMO) | Admitting: Anesthesiology

## 2017-01-15 ENCOUNTER — Encounter (HOSPITAL_COMMUNITY)
Admission: RE | Disposition: A | Discharge status: Home / Self Care | Payer: Self-pay | Source: Ambulatory Visit | Attending: General Surgery

## 2017-01-15 ENCOUNTER — Ambulatory Visit (HOSPITAL_COMMUNITY)
Admission: RE | Admit: 2017-01-15 | Discharge: 2017-01-15 | Disposition: A | Discharge status: Home / Self Care | Payer: Managed Care, Other (non HMO) | Source: Ambulatory Visit | Attending: General Surgery | Admitting: General Surgery

## 2017-01-15 DIAGNOSIS — K439 Ventral hernia without obstruction or gangrene: Secondary | ICD-10-CM | POA: Insufficient documentation

## 2017-01-15 DIAGNOSIS — Z791 Long term (current) use of non-steroidal anti-inflammatories (NSAID): Secondary | ICD-10-CM | POA: Insufficient documentation

## 2017-01-15 DIAGNOSIS — Z6837 Body mass index (BMI) 37.0-37.9, adult: Secondary | ICD-10-CM | POA: Insufficient documentation

## 2017-01-15 DIAGNOSIS — J449 Chronic obstructive pulmonary disease, unspecified: Secondary | ICD-10-CM | POA: Insufficient documentation

## 2017-01-15 DIAGNOSIS — K219 Gastro-esophageal reflux disease without esophagitis: Secondary | ICD-10-CM | POA: Insufficient documentation

## 2017-01-15 DIAGNOSIS — F419 Anxiety disorder, unspecified: Secondary | ICD-10-CM | POA: Insufficient documentation

## 2017-01-15 DIAGNOSIS — F17210 Nicotine dependence, cigarettes, uncomplicated: Secondary | ICD-10-CM | POA: Insufficient documentation

## 2017-01-15 DIAGNOSIS — Z79899 Other long term (current) drug therapy: Secondary | ICD-10-CM | POA: Insufficient documentation

## 2017-01-15 HISTORY — PX: INSERTION OF MESH: SHX5868

## 2017-01-15 HISTORY — PX: VENTRAL HERNIA REPAIR: SHX424

## 2017-01-15 SURGERY — REPAIR, HERNIA, VENTRAL
Anesthesia: General

## 2017-01-15 MED ORDER — SUFENTANIL CITRATE 50 MCG/ML IV SOLN
INTRAVENOUS | Status: AC
  Filled 2017-01-15: qty 1

## 2017-01-15 MED ORDER — LIDOCAINE 2% (20 MG/ML) 5 ML SYRINGE
INTRAMUSCULAR | Status: AC
  Filled 2017-01-15: qty 5

## 2017-01-15 MED ORDER — TISSEEL VH 10 ML EX KIT
PACK | CUTANEOUS | Status: AC
  Filled 2017-01-15: qty 1

## 2017-01-15 MED ORDER — BUPIVACAINE HCL (PF) 0.25 % IJ SOLN
INTRAMUSCULAR | Status: AC
  Filled 2017-01-15: qty 30

## 2017-01-15 MED ORDER — MIDAZOLAM HCL 2 MG/2ML IJ SOLN
INTRAMUSCULAR | Status: AC
  Filled 2017-01-15: qty 2

## 2017-01-15 MED ORDER — PROMETHAZINE HCL 25 MG/ML IJ SOLN
6.2500 mg | INTRAMUSCULAR | Status: DC | PRN
Start: 2017-01-15 — End: 2017-01-15

## 2017-01-15 MED ORDER — LACTATED RINGERS IV SOLN
INTRAVENOUS | Status: DC
  Administered 2017-01-15 (×3): via INTRAVENOUS

## 2017-01-15 MED ORDER — ACETAMINOPHEN 10 MG/ML IV SOLN
INTRAVENOUS | Status: DC | PRN
  Administered 2017-01-15: 1000 mg via INTRAVENOUS

## 2017-01-15 MED ORDER — TISSEEL VH 10 ML EX KIT
PACK | CUTANEOUS | Status: DC | PRN
  Administered 2017-01-15: 1

## 2017-01-15 MED ORDER — PROPOFOL 10 MG/ML IV BOLUS
INTRAVENOUS | Status: AC
  Filled 2017-01-15: qty 20

## 2017-01-15 MED ORDER — HYDROMORPHONE HCL 1 MG/ML IJ SOLN
0.2500 mg | INTRAMUSCULAR | Status: DC | PRN
Start: 2017-01-15 — End: 2017-01-15

## 2017-01-15 MED ORDER — SUGAMMADEX SODIUM 500 MG/5ML IV SOLN
INTRAVENOUS | Status: AC
  Filled 2017-01-15: qty 5

## 2017-01-15 MED ORDER — PROPOFOL 10 MG/ML IV BOLUS
INTRAVENOUS | Status: DC | PRN
  Administered 2017-01-15: 50 mg via INTRAVENOUS
  Administered 2017-01-15: 200 mg via INTRAVENOUS

## 2017-01-15 MED ORDER — MIDAZOLAM HCL 2 MG/2ML IJ SOLN: 0.5000 mg | Freq: Once | INTRAMUSCULAR | Status: DC | PRN

## 2017-01-15 MED ORDER — MEPERIDINE HCL 50 MG/ML IJ SOLN: 6.2500 mg | INTRAMUSCULAR | Status: DC | PRN

## 2017-01-15 MED ORDER — SUGAMMADEX SODIUM 500 MG/5ML IV SOLN
INTRAVENOUS | Status: DC | PRN
  Administered 2017-01-15: 250 mg via INTRAVENOUS

## 2017-01-15 MED ORDER — CEFAZOLIN SODIUM-DEXTROSE 2-4 GM/100ML-% IV SOLN
INTRAVENOUS | Status: AC
  Filled 2017-01-15: qty 100

## 2017-01-15 MED ORDER — OXYCODONE-ACETAMINOPHEN 5-325 MG PO TABS: 1.0000 | ORAL_TABLET | ORAL | 0 refills | Status: DC | PRN

## 2017-01-15 MED ORDER — ROCURONIUM BROMIDE 50 MG/5ML IV SOSY
PREFILLED_SYRINGE | INTRAVENOUS | Status: AC
Start: 2017-01-15 — End: 2017-01-15
  Filled 2017-01-15: qty 15

## 2017-01-15 MED ORDER — MORPHINE SULFATE (PF) 10 MG/ML IV SOLN
2.0000 mg | INTRAVENOUS | Status: DC | PRN
  Administered 2017-01-15: 2 mg via INTRAVENOUS
  Filled 2017-01-15: qty 1

## 2017-01-15 MED ORDER — LIDOCAINE HCL (CARDIAC) 20 MG/ML IV SOLN
INTRAVENOUS | Status: DC | PRN
  Administered 2017-01-15: 100 mg via INTRAVENOUS
  Administered 2017-01-15: 50 mg via INTRAVENOUS

## 2017-01-15 MED ORDER — CEFAZOLIN SODIUM-DEXTROSE 2-4 GM/100ML-% IV SOLN
2.0000 g | INTRAVENOUS | Status: AC
  Administered 2017-01-15: 2 g via INTRAVENOUS

## 2017-01-15 MED ORDER — CHLORHEXIDINE GLUCONATE CLOTH 2 % EX PADS: 6.0000 | MEDICATED_PAD | Freq: Once | CUTANEOUS | Status: DC

## 2017-01-15 MED ORDER — ACETAMINOPHEN 650 MG RE SUPP
650.0000 mg | RECTAL | Status: DC | PRN
  Filled 2017-01-15: qty 1

## 2017-01-15 MED ORDER — BUPIVACAINE HCL (PF) 0.25 % IJ SOLN
INTRAMUSCULAR | Status: DC | PRN
  Administered 2017-01-15: 20 mL

## 2017-01-15 MED ORDER — OXYCODONE HCL 5 MG PO TABS
ORAL_TABLET | ORAL | Status: AC
  Filled 2017-01-15: qty 2

## 2017-01-15 MED ORDER — ACETAMINOPHEN 10 MG/ML IV SOLN
INTRAVENOUS | Status: AC
  Filled 2017-01-15: qty 100

## 2017-01-15 MED ORDER — ROCURONIUM BROMIDE 100 MG/10ML IV SOLN
INTRAVENOUS | Status: DC | PRN
  Administered 2017-01-15 (×3): 10 mg via INTRAVENOUS
  Administered 2017-01-15: 50 mg via INTRAVENOUS
  Administered 2017-01-15: 10 mg via INTRAVENOUS

## 2017-01-15 MED ORDER — SUFENTANIL CITRATE 50 MCG/ML IV SOLN
INTRAVENOUS | Status: DC | PRN
  Administered 2017-01-15: 5 ug via INTRAVENOUS
  Administered 2017-01-15: 10 ug via INTRAVENOUS
  Administered 2017-01-15: 5 ug via INTRAVENOUS

## 2017-01-15 MED ORDER — ONDANSETRON HCL 4 MG/2ML IJ SOLN
INTRAMUSCULAR | Status: DC | PRN
  Administered 2017-01-15: 4 mg via INTRAVENOUS

## 2017-01-15 MED ORDER — OXYCODONE HCL 5 MG PO TABS
5.0000 mg | ORAL_TABLET | ORAL | Status: DC | PRN
  Administered 2017-01-15: 10 mg via ORAL

## 2017-01-15 MED ORDER — ACETAMINOPHEN 325 MG PO TABS: 650.0000 mg | ORAL_TABLET | ORAL | Status: DC | PRN

## 2017-01-15 MED ORDER — 0.9 % SODIUM CHLORIDE (POUR BTL) OPTIME
TOPICAL | Status: DC | PRN
  Administered 2017-01-15: 1000 mL

## 2017-01-15 MED ORDER — DEXAMETHASONE SODIUM PHOSPHATE 4 MG/ML IJ SOLN
INTRAMUSCULAR | Status: DC | PRN
  Administered 2017-01-15: 10 mg via INTRAVENOUS

## 2017-01-15 MED ORDER — MIDAZOLAM HCL 5 MG/5ML IJ SOLN
INTRAMUSCULAR | Status: DC | PRN
  Administered 2017-01-15: 2 mg via INTRAVENOUS

## 2017-01-15 SURGICAL SUPPLY — 45 items
APL SKNCLS STERI-STRIP NONHPOA (GAUZE/BANDAGES/DRESSINGS) ×1
BENZOIN TINCTURE PRP APPL 2/3 (GAUZE/BANDAGES/DRESSINGS) ×1 IMPLANT
BINDER ABDOMINAL 12 ML 46-62 (SOFTGOODS) IMPLANT
CABLE HIGH FREQUENCY MONO STRZ (ELECTRODE) ×1 IMPLANT
COVER SURGICAL LIGHT HANDLE (MISCELLANEOUS) ×1 IMPLANT
DECANTER SPIKE VIAL GLASS SM (MISCELLANEOUS) IMPLANT
DRAIN CHANNEL 19F RND (DRAIN) IMPLANT
DRAPE LAPAROSCOPIC ABDOMINAL (DRAPES) ×2 IMPLANT
ELECT PENCIL ROCKER SW 15FT (MISCELLANEOUS) ×1 IMPLANT
ELECT REM PT RETURN 9FT ADLT (ELECTROSURGICAL) ×2
ELECTRODE REM PT RTRN 9FT ADLT (ELECTROSURGICAL) ×1 IMPLANT
EVACUATOR SILICONE 100CC (DRAIN) IMPLANT
GAUZE SPONGE 4X4 12PLY STRL (GAUZE/BANDAGES/DRESSINGS) ×1 IMPLANT
GLOVE BIO SURGEON STRL SZ7.5 (GLOVE) ×2 IMPLANT
GOWN STRL REUS W/TWL XL LVL3 (GOWN DISPOSABLE) ×4 IMPLANT
GRASPER SUT TROCAR 14GX15 (MISCELLANEOUS) IMPLANT
KIT BASIN OR (CUSTOM PROCEDURE TRAY) ×2 IMPLANT
LIQUID BAND (GAUZE/BANDAGES/DRESSINGS) ×2 IMPLANT
MARKER SKIN DUAL TIP RULER LAB (MISCELLANEOUS) ×2 IMPLANT
MESH BARD SOFT 6X6IN (Mesh General) ×1 IMPLANT
NEEDLE HYPO 22GX1.5 SAFETY (NEEDLE) IMPLANT
PACK GENERAL/GYN (CUSTOM PROCEDURE TRAY) ×2 IMPLANT
SCISSORS LAP 5X35 DISP (ENDOMECHANICALS) ×1 IMPLANT
SLEEVE XCEL OPT CAN 5 100 (ENDOMECHANICALS) ×1 IMPLANT
STAPLER VISISTAT 35W (STAPLE) IMPLANT
STRIP CLOSURE SKIN 1/4X4 (GAUZE/BANDAGES/DRESSINGS) ×1 IMPLANT
SUT ETHILON 3 0 PS 1 (SUTURE) IMPLANT
SUT MNCRL AB 4-0 PS2 18 (SUTURE) ×1 IMPLANT
SUT NOVA NAB GS-21 0 18 T12 DT (SUTURE) IMPLANT
SUT PDS AB 1 CTX 36 (SUTURE) IMPLANT
SUT PDS AB 2-0 CT2 27 (SUTURE) ×4 IMPLANT
SUT SILK 3 0 (SUTURE)
SUT SILK 3 0 SH CR/8 (SUTURE) IMPLANT
SUT SILK 3-0 18XBRD TIE 12 (SUTURE) IMPLANT
SUT VIC AB 2-0 CT1 27 (SUTURE)
SUT VIC AB 2-0 CT1 27XBRD (SUTURE) IMPLANT
SUT VIC AB 2-0 SH 27 (SUTURE) ×2
SUT VIC AB 2-0 SH 27X BRD (SUTURE) IMPLANT
SUT VIC AB 3-0 SH 18 (SUTURE) IMPLANT
SUT VIC AB 3-0 SH 27 (SUTURE)
SUT VIC AB 3-0 SH 27XBRD (SUTURE) IMPLANT
SYR 20CC LL (SYRINGE) ×1 IMPLANT
TOWEL OR 17X26 10 PK STRL BLUE (TOWEL DISPOSABLE) ×2 IMPLANT
TRAY FOLEY W/METER SILVER 16FR (SET/KITS/TRAYS/PACK) ×1 IMPLANT
YANKAUER SUCT BULB TIP NO VENT (SUCTIONS) ×1 IMPLANT

## 2017-01-15 NOTE — Anesthesia Postprocedure Evaluation (Signed)
Anesthesia Post Note  Patient: Steven Zimmerman  Procedure(s) Performed: Procedure(s) (LRB): LAPRASCOPIC ASSISTED OPEN RECURRENT VENTRAL HERNIA REPAIR WITH MESH (N/A) INSERTION OF MESH (N/A)  Patient location during evaluation: PACU Anesthesia Type: General Level of consciousness: awake and alert, oriented and patient cooperative Pain management: pain level controlled Vital Signs Assessment: post-procedure vital signs reviewed and stable Respiratory status: spontaneous breathing, nonlabored ventilation and respiratory function stable Cardiovascular status: blood pressure returned to baseline and stable Postop Assessment: no signs of nausea or vomiting Anesthetic complications: no       Last Vitals:  Vitals:   01/15/17 1129 01/15/17 1313  BP: 112/81 119/78  Pulse: 64 73  Resp: 16 20  Temp: 36.4 C     Last Pain:  Vitals:   01/15/17 1313  TempSrc:   PainSc: 5                  Colsen Modi,E. Rainey Kahrs

## 2017-01-15 NOTE — Op Note (Signed)
01/15/2017  10:38 AM  PATIENT:  Steven Zimmerman  56 y.o. male  PRE-OPERATIVE DIAGNOSIS:  VENTRAL HERNIA  POST-OPERATIVE DIAGNOSIS: VENTRAL HERNIA  PROCEDURE:  Procedure(s): LAPRASCOPIC ASSISTED, RETRORECTUS HERNIA REPAIR WITH MESH  SURGEON:  Surgeon(s) and Role:    * Ralene Ok, MD - Primary  ANESTHESIA:   local and general  EBL:  Total I/O In: 1000 [I.V.:1000] Out: 450 [Urine:400; Blood:50]  BLOOD ADMINISTERED:none  DRAINS: none   LOCAL MEDICATIONS USED:  BUPIVICAINE   SPECIMEN:  No Specimen  DISPOSITION OF SPECIMEN:  N/A  COUNTS:  YES  TOURNIQUET:  * No tourniquets in log *  DICTATION: .Dragon Dictation  Indication for procedure: Patient is a 56 year old male who presented to clinic secondary to a epigastric abdominal wall hernia. Patient had a previous recurrent hernia repaired laparoscopically. This appeared to be cephalad to the previous hernia repair.  Details of procedure:  After the patient was consented he was taken back to the operating room and placed in the supine position with bilateral SCDs in place. Patient underwent general endotracheal anesthesia. Time was called all facts verified.  At this time a Veress needle technique was used to inspect the abdomen to 15 mmHg in the left subcostal margin. Subsequent to this, trocar and camera placed intra-abdominally. There is no injury to any intra-abdominal organs. There is large amount of omental adhesions to the midline previous mesh placement in the midline. At this time 5 mm trocar was placed in the left lower quadrant direct visualization. Cautery was used to maintain hemostasis and sharp dissection was used to take down the omentum from the anterior abdominal wall. Both the 2 previous meshes were seen to be intact. The hernia appeared to be cephalad to the most recent 12 cm parietex mesh. This did not appear to be a recurrent hernia at this appeared to be an area where the previous stitch had been placed  and possible port site.  Once the omentum adhesions were taken down. A incision was made over the area of the hernia. The hernia sac was dissected around the subcutaneous tissue. This was excised. At this time the rectus fascia was then elevated between 2 Coker's. The posterior rectus sheath was then dissected away from the rectus muscle. This was done bilaterally. In the both cephalad and caudad directions the linea alba was taken down and the peritoneum was taken down the midline. A space was created both cephalad and caudad approximately 5 cm. This proximal 6 cm of posterior rectus sheath bilaterally dissected away from the rectus muscles. At this time the posterior rectus sheath was then reapproximated using a 2-0 PDS in a standard running fashion 2.   At this time the space was then measured. This measured approximately 12 x 8 cm. At this time a Bard soft macroporous mesh was then cut to this size. This was placed into the retrorectus space. This was secured to the midline fascial closure using 2-0 Vicryl in the most cephalad in caudad area as well as the middle of the mesh.  At this time to seal glue was used to secure the mesh to the posterior rectus fascia. This lay down flat and had good overlap from the midline fascia area by at least 4 cm bilaterally. At this time the anterior fascia was reapproximated using a 2-0 PDS in a standard running fashion 2. The subcutaneous tissue was reapproximated using an 0 Vicryl in interrupted fashion. The skin was reapproximated using a 4 Monocryl subcutaneous fashion.  At  this time especially begun. The camera was placed into the abdomen. It was apparent that the hernia was reapproximated appropriately. At this time the insufflation was evacuated. Trochars removed. The midline incision was then dressed with benzoin, Steri-Strips, and tape. The trocar sites were also reapproximated using benzoin, sterile drapes, gauze and tape. The patient how the procedure well  was taken to the recovery room in stable condition.   PLAN OF CARE: Discharge to home after PACU  PATIENT DISPOSITION:  PACU - hemodynamically stable.   Delay start of Pharmacological VTE agent (>24hrs) due to surgical blood loss or risk of bleeding: not applicable

## 2017-01-15 NOTE — Progress Notes (Signed)
Patient is very reluctant to go home as with previous surgery incision has come apart and bleed. Up to bathroom and patient states it

## 2017-01-15 NOTE — H&P (Signed)
History of Present IllnessThe patient is a 56 year old male who presents with an incisional hernia. 56 year old male who comes in today secondary to a recurrent epigastric bulge. Patient had surgery in December 2015. Patient a laparoscopic ventral hernia repair which was a recurrence from her previous umbilical repair.  Patient states that the bulge has gotten bigger. He states he does not have any pain at this time. 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 He does state that he does do some heavy lifting. He noticed no particular time when he noticed a bulge and pain.  Patient is due for a colonoscopy by Dr. Ardis Hughs. From my standpoint it is okay to proceed with colonoscopy prior to any definitive hernia surgery.   Allergies No Known Drug Allergies 08/14/2014  Medication History Ibuprofen (200MG  Tablet, Oral d3) Active. No Current Medications (Taken starting 12/01/2016) Steven Zimmerman (Oral daily) Active. Protonix (40MG  Tablet DR, Oral daily) Active. RaNITidine HCl (75MG  Tablet, Oral daily) Active. Percocet (5-325MG  Tablet, 1-2 Tablet Oral q 4-6 hours, Taken starting 11/10/2014) Active. Medications Reconciled  BP 118/83   Pulse 71   Temp 97.7 F (36.5 C) (Oral)   Resp 16   Ht 5\' 10"  (1.778 m)   Wt 117 kg (258 lb)   SpO2 97%   BMI 37.02 kg/m     Physical ExamGeneral Mental Status-Alert. General Appearance-Consistent with stated age. Hydration-Well hydrated. Voice-Normal.  Chest and Lung Exam Chest and lung exam reveals -quiet, even and easy respiratory effort with no use of accessory muscles and on auscultation, normal breath sounds, no adventitious sounds and normal vocal resonance. Inspection Chest Wall - Normal. Back - normal.  Cardiovascular Cardiovascular examination reveals -normal heart sounds, regular rate and rhythm with no murmurs and normal pedal pulses bilaterally.  Abdomen Note: Supraumbilical  recurrent hernia. Defect appears to be 6 x 8 cm.     Assessment & Plan  RECURRENT VENTRAL HERNIA (K43.2) Impression: 56 year old male with a recurrent ventral hernia. 1.We will proceed to the OR for an open ventral hernia repair with mesh. This will likely require either a retrorectus orTAR hernia repair. 2. Discussed with the patient the risks and benefits of the procedure to include but not limited to: Infection, bleeding, damage to structures, possibility of for further surgery, possible recurrence. The patient was understanding and wished to proceed.

## 2017-01-15 NOTE — Anesthesia Preprocedure Evaluation (Addendum)
Anesthesia Evaluation  Patient identified by MRN, date of birth, ID band Patient awake    Reviewed: Allergy & Precautions, NPO status , Patient's Chart, lab work & pertinent test results  History of Anesthesia Complications (+) history of anesthetic complications (dental damage)  Airway Mallampati: III   Neck ROM: Full    Dental  (+) Missing, Dental Advisory Given, Chipped   Pulmonary COPD, Current Smoker,    breath sounds clear to auscultation       Cardiovascular negative cardio ROS   Rhythm:Regular Rate:Normal     Neuro/Psych Anxiety negative neurological ROS     GI/Hepatic Neg liver ROS, GERD  Medicated and Controlled,  Endo/Other  Morbid obesity  Renal/GU negative Renal ROS     Musculoskeletal   Abdominal (+) + obese,   Peds  Hematology negative hematology ROS (+)   Anesthesia Other Findings   Reproductive/Obstetrics                            Anesthesia Physical Anesthesia Plan  ASA: II  Anesthesia Plan: General   Post-op Pain Management:    Induction: Intravenous  Airway Management Planned: Oral ETT and Video Laryngoscope Planned  Additional Equipment:   Intra-op Plan:   Post-operative Plan: Extubation in OR  Informed Consent: I have reviewed the patients History and Physical, chart, labs and discussed the procedure including the risks, benefits and alternatives for the proposed anesthesia with the patient or authorized representative who has indicated his/her understanding and acceptance.   Dental advisory given  Plan Discussed with: CRNA and Surgeon  Anesthesia Plan Comments: (Plan routine monitors, GETA with VideoGlide intubation)        Anesthesia Quick Evaluation

## 2017-01-15 NOTE — Anesthesia Procedure Notes (Signed)
Procedure Name: Intubation Date/Time: 01/15/2017 8:50 AM Performed by: Estil Vallee, Virgel Gess Pre-anesthesia Checklist: Patient identified, Emergency Drugs available, Suction available, Patient being monitored and Timeout performed Patient Re-evaluated:Patient Re-evaluated prior to inductionOxygen Delivery Method: Circle system utilized Preoxygenation: Pre-oxygenation with 100% oxygen Intubation Type: IV induction Ventilation: Two handed mask ventilation required Laryngoscope Size: Glidescope Grade View: Grade I Tube type: Oral Tube size: 7.5 mm Number of attempts: 1 Airway Equipment and Method: Video-laryngoscopy and Rigid stylet Placement Confirmation: ETT inserted through vocal cords under direct vision,  positive ETCO2 and breath sounds checked- equal and bilateral Secured at: 22 cm Tube secured with: Tape Dental Injury: Teeth and Oropharynx as per pre-operative assessment  Comments: Intubated by Jackquline Bosch SRNA

## 2017-01-15 NOTE — Discharge Instructions (Signed)
General Anesthesia, Adult, Care After These instructions provide you with information about caring for yourself after your procedure. Your health care provider may also give you more specific instructions. Your treatment has been planned according to current medical practices, but problems sometimes occur. Call your health care provider if you have any problems or questions after your procedure. What can I expect after the procedure? After the procedure, it is common to have:  Vomiting.  A sore throat.  Mental slowness. It is common to feel:  Nauseous.  Cold or shivery.  Sleepy.  Tired.  Sore or achy, even in parts of your body where you did not have surgery. Follow these instructions at home: For at least 24 hours after the procedure:  Do not:  Participate in activities where you could fall or become injured.  Drive.  Use heavy machinery.  Drink alcohol.  Take sleeping pills or medicines that cause drowsiness.  Make important decisions or sign legal documents.  Take care of children on your own.  Rest. Eating and drinking  If you vomit, drink water, juice, or soup when you can drink without vomiting.  Drink enough fluid to keep your urine clear or pale yellow.  Make sure you have little or no nausea before eating solid foods.  Follow the diet recommended by your health care provider. General instructions  Have a responsible adult stay with you until you are awake and alert.  Return to your normal activities as told by your health care provider. Ask your health care provider what activities are safe for you.  Take over-the-counter and prescription medicines only as told by your health care provider.  If you smoke, do not smoke without supervision.  Keep all follow-up visits as told by your health care provider. This is important. Contact a health care provider if:  You continue to have nausea or vomiting at home, and medicines are not helpful.  You  cannot drink fluids or start eating again.  You cannot urinate after 8-12 hours.  You develop a skin rash.  You have fever.  You have increasing redness at the site of your procedure. Get help right away if:  You have difficulty breathing.  You have chest pain.  You have unexpected bleeding.  You feel that you are having a life-threatening or urgent problem. This information is not intended to replace advice given to you by your health care provider. Make sure you discuss any questions you have with your health care provider. Document Released: 02/16/2001 Document Revised: 04/14/2016 Document Reviewed: 10/25/2015 Elsevier Interactive Patient Education  2017 [place] _______[facility]  HERNIA REPAIR: POST OP INSTRUCTIONS  Always review your discharge instruction sheet given to you by the facility where your surgery was performed. IF YOU HAVE DISABILITY OR FAMILY LEAVE FORMS, YOU MUST BRING THEM TO THE OFFICE FOR PROCESSING.   DO NOT GIVE THEM TO YOUR DOCTOR.  1. A  prescription for pain medication may be given to you upon discharge.  Take your pain medication as prescribed, if needed.  If narcotic pain medicine is not needed, then you may take acetaminophen (Tylenol) or ibuprofen (Advil) as needed. 2. Take your usually prescribed medications unless otherwise directed. If you need a refill on your pain medication, please contact your pharmacy.  They will contact our office to request authorization. Prescriptions will not be filled after 5 pm or on week-ends. 3. You should follow a light diet the first 24 hours after  arrival home, such as soup and crackers, etc.  Be sure to include lots of fluids daily.  Resume your normal diet the day after surgery. 4.Most patients will experience some swelling and bruising around the umbilicus or in the groin and scrotum.  Ice packs and reclining will help.  Swelling and bruising can take several days to resolve.   6. It is common to experience some constipation if taking pain medication after surgery.  Increasing fluid intake and taking a stool softener (such as Colace) will usually help or prevent this problem from occurring.  A mild laxative (Milk of Magnesia or Miralax) should be taken according to package directions if there are no bowel movements after 48 hours. 7. Unless discharge instructions indicate otherwise, you may remove your bandages 24-48 hours after surgery, and you may shower at that time.  You may have steri-strips (small skin tapes) in place directly over the incision.  These strips should be left on the skin for 7-10 days.  8. ACTIVITIES:  You may resume regular (light) daily activities beginning the next day--such as daily self-care, walking, climbing stairs--gradually increasing activities as tolerated.  You may have sexual intercourse when it is comfortable.  Refrain from any heavy lifting or straining until approved by your doctor.  a.You may drive when you are no longer taking prescription pain medication, you can comfortably wear a seatbelt, and you can safely maneuver your car and apply brakes. b.RETURN TO WORK:   _____________________________________________  9.You should see your doctor in the office for a follow-up appointment approximately 2-3 weeks after your surgery.  Make sure that you call for this appointment within a day or two after you arrive home to insure a convenient appointment time. 10.OTHER INSTRUCTIONS: _________________________    _____________________________________  WHEN TO CALL YOUR DOCTOR: 1. Fever over 101.0 2. Inability to urinate 3. Nausea and/or vomiting 4. Extreme swelling or bruising 5. Continued bleeding from incision. 6. Increased pain, redness, or drainage from the incision  The clinic staff is available to answer your questions during regular business hours.  Please dont hesitate to call and ask to speak to one of the nurses for clinical  concerns.  If you have a medical emergency, go to the nearest emergency room or call 911.  A surgeon from [facility] is always on call at the hospital   [address], [place], [place], [place]  [address] ?  [address], [place], [place]   [address] [phone] ? [phone] ? FAX [phone] Web site: [email or web address]

## 2017-01-15 NOTE — Transfer of Care (Signed)
Immediate Anesthesia Transfer of Care Note  Patient: Steven Zimmerman  Procedure(s) Performed: Procedure(s): LAPRASCOPIC ASSISTED OPEN RECURRENT VENTRAL HERNIA REPAIR WITH MESH (N/A) INSERTION OF MESH (N/A)  Patient Location: PACU  Anesthesia Type:General  Level of Consciousness:  sedated, patient cooperative and responds to stimulation  Airway & Oxygen Therapy:Patient Spontanous Breathing and Patient connected to face mask oxgen  Post-op Assessment:  Report given to PACU RN and Post -op Vital signs reviewed and stable  Post vital signs:  Reviewed and stable  Last Vitals:  Vitals:   01/15/17 0653  BP: 118/83  Pulse: 71  Resp: 16  Temp: [id number] C    Complications: No apparent anesthesia complications

## 2017-01-16 ENCOUNTER — Encounter (HOSPITAL_COMMUNITY): Payer: Self-pay | Admitting: General Surgery

## 2017-04-09 ENCOUNTER — Other Ambulatory Visit (HOSPITAL_COMMUNITY): Payer: Self-pay | Admitting: General Surgery

## 2017-04-09 DIAGNOSIS — K432 Incisional hernia without obstruction or gangrene: Secondary | ICD-10-CM

## 2017-04-10 ENCOUNTER — Ambulatory Visit (HOSPITAL_COMMUNITY)
Admission: RE | Admit: 2017-04-10 | Discharge: 2017-04-10 | Disposition: A | Discharge status: Home / Self Care | Payer: 59 | Source: Ambulatory Visit | Attending: General Surgery | Admitting: General Surgery

## 2017-04-10 DIAGNOSIS — I700 Atherosclerosis of aorta: Secondary | ICD-10-CM | POA: Diagnosis not present

## 2017-04-10 DIAGNOSIS — K432 Incisional hernia without obstruction or gangrene: Secondary | ICD-10-CM | POA: Insufficient documentation

## 2018-01-18 ENCOUNTER — Ambulatory Visit: Payer: Managed Care, Other (non HMO) | Admitting: Family Medicine

## 2018-01-18 ENCOUNTER — Ambulatory Visit (INDEPENDENT_AMBULATORY_CARE_PROVIDER_SITE_OTHER): Payer: 59 | Admitting: Family Medicine

## 2018-01-18 ENCOUNTER — Encounter: Payer: Self-pay | Admitting: Family Medicine

## 2018-01-18 VITALS — BP 110/78 | HR 62 | Wt 248.8 lb

## 2018-01-18 DIAGNOSIS — K439 Ventral hernia without obstruction or gangrene: Secondary | ICD-10-CM | POA: Diagnosis not present

## 2018-01-18 DIAGNOSIS — Z0001 Encounter for general adult medical examination with abnormal findings: Secondary | ICD-10-CM | POA: Diagnosis not present

## 2018-01-18 DIAGNOSIS — Z720 Tobacco use: Secondary | ICD-10-CM | POA: Insufficient documentation

## 2018-01-18 DIAGNOSIS — Z0000 Encounter for general adult medical examination without abnormal findings: Secondary | ICD-10-CM | POA: Insufficient documentation

## 2018-01-18 DIAGNOSIS — R972 Elevated prostate specific antigen [PSA]: Secondary | ICD-10-CM

## 2018-01-18 NOTE — Progress Notes (Addendum)
Subjective:  Patient ID: Steven Zimmerman, male    DOB: 25-Oct-1961  Age: 57 y.o. MRN: [id number]  CC: New Patient (Initial Visit)   HPI Steven Zimmerman presents for establishment of care. He is  Non fasting. Significant pmh of multiple hernia surgeries involving the inguinal and abdominal wall he says. He lives with his wife and grand child. He smokes about a ppd. He has up to 6-8 alcoholic drinks on the weekends but tells me that he hasn't had anything to drink in a few weeks. He is exercising by walking and using the treadmill. He works in the [employer] with [employer]. His mother is in her 26s and recently diagnosed with colon caner. Pt had a negative colonoscopy in 2018 he tells me. Pt's father passed from esophageal cancer in his 58s.   History Steven Zimmerman has a past medical history of Anxiety, Cancer ([place]), Colon polyps (2006), GERD (gastroesophageal reflux disease), History of hiatal hernia, and Pneumonia.   He has a past surgical history that includes Vasectomy (1994); Hernia repair (2008); Ventral hernia repair (N/A, 01/15/2017); and Insertion of mesh (N/A, 01/15/2017).   His family history includes Colon cancer in his mother; Esophageal cancer in his father.He reports that he has been smoking cigarettes.  He has been smoking about 1.00 pack per day. He has never used smokeless tobacco. He reports that he drinks alcohol. He reports that he does not use drugs.  Outpatient Medications Prior to Visit  Medication Sig Dispense Refill  . ibuprofen (ADVIL,MOTRIN) 200 MG tablet Take 200 mg by mouth every 6 (six) hours as needed.    . ranitidine (ZANTAC) 150 MG tablet Take 150 mg by mouth daily as needed for heartburn.    Marland Kitchen oxyCODONE-acetaminophen (ROXICET) 5-325 MG tablet Take 1-2 tablets by mouth every 4 (four) hours as needed. (Patient not taking: Reported on 01/18/2018) 30 tablet 0   Facility-Administered Medications Prior to Visit  Medication Dose Route Frequency Provider Last Rate Last  Dose  . 0.9 %  sodium chloride infusion  500 mL Intravenous Continuous Milus Banister, MD        ROS Review of Systems  Constitutional: Negative.   HENT: Negative.   Eyes: Negative.   Respiratory: Negative.   Cardiovascular: Negative.   Gastrointestinal: Positive for abdominal distention. Negative for abdominal pain, anal bleeding, blood in stool, constipation, diarrhea, nausea, rectal pain and vomiting.  Endocrine: Negative for polyphagia and polyuria.  Genitourinary: Negative for difficulty urinating, frequency and hematuria.  Musculoskeletal: Negative for gait problem and myalgias.  Skin: Negative for color change and rash.  Allergic/Immunologic: Negative for immunocompromised state.  Neurological: Negative for weakness and headaches.  Hematological: Does not bruise/bleed easily.  Psychiatric/Behavioral: Negative.     Objective:  BP 110/78 (BP Location: Right Arm, Patient Position: Sitting, Cuff Size: Large)   Pulse 62   Wt 248 lb 12.8 oz (112.9 kg)   BMI 35.70 kg/m   Physical Exam  Constitutional: He is oriented to person, place, and time. He appears well-developed and well-nourished. No distress.  HENT:  Head: Normocephalic and atraumatic.  Right Ear: External ear normal.  Left Ear: External ear normal.  Mouth/Throat: Oropharynx is clear and moist. No oropharyngeal exudate.  Eyes: Conjunctivae are normal. Pupils are equal, round, and reactive to light. Right eye exhibits no discharge. Left eye exhibits no discharge. No scleral icterus.  Neck: Neck supple. No JVD present. No tracheal deviation present. No thyromegaly present.  Cardiovascular: Normal rate, regular rhythm and normal  heart sounds.  Pulmonary/Chest: Effort normal and breath sounds normal. No stridor. No respiratory distress. He has no wheezes. He has no rales.  Abdominal: Soft. Bowel sounds are normal. He exhibits distension. There is no tenderness. There is no rebound and no guarding. A hernia is present.  Hernia confirmed positive in the ventral area.    Genitourinary: Rectal exam shows no external hemorrhoid, no internal hemorrhoid, no fissure, no mass, no tenderness, anal tone normal and guaiac negative stool. Prostate is enlarged. Prostate is not tender.  Lymphadenopathy:    He has no cervical adenopathy.  Neurological: He is alert and oriented to person, place, and time.  Skin: Skin is warm and dry. He is not diaphoretic.  Psychiatric: He has a normal mood and affect. His behavior is normal.      Assessment & Plan:   Steven Zimmerman was seen today for new patient (initial visit).  Diagnoses and all orders for this visit:  Ventral hernia without obstruction or gangrene -     Cancel: Ambulatory referral to General Surgery -     CBC -     Cancel: Comprehensive metabolic panel -     Comprehensive metabolic panel -     Ambulatory referral to General Surgery  Encounter for health maintenance examination with abnormal findings -     CBC -     Cancel: Comprehensive metabolic panel -     Lipid panel -     PSA; Future -     Urinalysis, Routine w reflex microscopic; Future -     Comprehensive metabolic panel  Tobacco abuse  Increased prostate specific antigen (PSA) velocity -     Ambulatory referral to Urology   I am having Leane Platt maintain his ibuprofen, ranitidine, and oxyCODONE-acetaminophen. We will continue to administer sodium chloride.  No orders of the defined types were placed in this encounter.  Patient will return fasting for above ordered blood work.  I am referring him back to surgery for follow-up of his extensive ventral hernia.  Of asked him to follow-up in a month. Continue exercising and weight loss. Please stop smoking.   Follow-up: Return in about 1 month (around 02/15/2018).  Libby Maw, MD

## 2018-01-18 NOTE — Patient Instructions (Addendum)
Coping with Quitting Smoking Quitting smoking is a physical and mental challenge. You will face cravings, withdrawal symptoms, and temptation. Before quitting, work with your health care provider to make a plan that can help you cope. Preparation can help you quit and keep you from giving in. How can I cope with cravings? Cravings usually last for 5-10 minutes. If you get through it, the craving will pass. Consider taking the following actions to help you cope with cravings:  Keep your mouth busy: ? Chew sugar-free gum. ? Suck on hard candies or a straw. ? Brush your teeth.  Keep your hands and body busy: ? Immediately change to a different activity when you feel a craving. ? Squeeze or play with a ball. ? Do an activity or a hobby, like making bead jewelry, practicing needlepoint, or working with wood. ? Mix up your normal routine. ? Take a short exercise break. Go for a quick walk or run up and down stairs. ? Spend time in public places where smoking is not allowed.  Focus on doing something kind or helpful for someone else.  Call a friend or family member to talk during a craving.  Join a support group.  Call a quit line, such as 1-800-QUIT-NOW.  Talk with your health care provider about medicines that might help you cope with cravings and make quitting easier for you.  How can I deal with withdrawal symptoms? Your body may experience negative effects as it tries to get used to not having nicotine in the system. These effects are called withdrawal symptoms. They may include:  Feeling hungrier than normal.  Trouble concentrating.  Irritability.  Trouble sleeping.  Feeling depressed.  Restlessness and agitation.  Craving a cigarette.  To manage withdrawal symptoms:  Avoid places, people, and activities that trigger your cravings.  Remember why you want to quit.  Get plenty of sleep.  Avoid coffee and other caffeinated drinks. These may worsen some of your  symptoms.  How can I handle social situations? Social situations can be difficult when you are quitting smoking, especially in the first few weeks. To manage this, you can:  Avoid parties, bars, and other social situations where people might be smoking.  Avoid alcohol.  Leave right away if you have the urge to smoke.  Explain to your family and friends that you are quitting smoking. Ask for understanding and support.  Plan activities with friends or family where smoking is not an option.  What are some ways I can cope with stress? Wanting to smoke may cause stress, and stress can make you want to smoke. Find ways to manage your stress. Relaxation techniques can help. For example:  Breathe slowly and deeply, in through your nose and out through your mouth.  Listen to soothing, relaxing music.  Talk with a family member or friend about your stress.  Light a candle.  Soak in a bath or take a shower.  Think about a peaceful place.  What are some ways I can prevent weight gain? Be aware that many people gain weight after they quit smoking. However, not everyone does. To keep from gaining weight, have a plan in place before you quit and stick to the plan after you quit. Your plan should include:  Having healthy snacks. When you have a craving, it may help to: ? Eat plain popcorn, crunchy carrots, celery, or other cut vegetables. ? Chew sugar-free gum.  Changing how you eat: ? Eat small portion sizes at meals. ?  Eat 4-6 small meals throughout the day instead of 1-2 large meals a day. ? Be mindful when you eat. Do not watch television or do other things that might distract you as you eat.  Exercising regularly: ? Make time to exercise each day. If you do not have time for a long workout, do short bouts of exercise for 5-10 minutes several times a day. ? Do some form of strengthening exercise, like weight lifting, and some form of aerobic exercise, like running or  swimming.  Drinking plenty of water or other low-calorie or no-calorie drinks. Drink 6-8 glasses of water daily, or as much as instructed by your health care provider.  Summary  Quitting smoking is a physical and mental challenge. You will face cravings, withdrawal symptoms, and temptation to smoke again. Preparation can help you as you go through these challenges.  You can cope with cravings by keeping your mouth busy (such as by chewing gum), keeping your body and hands busy, and making calls to family, friends, or a helpline for people who want to quit smoking.  You can cope with withdrawal symptoms by avoiding places where people smoke, avoiding drinks with caffeine, and getting plenty of rest.  Ask your health care provider about the different ways to prevent weight gain, avoid stress, and handle social situations. This information is not intended to replace advice given to you by your health care provider. Make sure you discuss any questions you have with your health care provider. Document Released: 11/07/2016 Document Revised: 11/07/2016 Document Reviewed: 11/07/2016 Elsevier Interactive Patient Education  2018 [place] Years, Male Preventive care refers to lifestyle choices and visits with your health care provider that can promote health and wellness. What does preventive care include?  A yearly physical exam. This is also called an annual well check.  Dental exams once or twice a year.  Routine eye exams. Ask your health care provider how often you should have your eyes checked.  Personal lifestyle choices, including: ? Daily care of your teeth and gums. ? Regular physical activity. ? Eating a healthy diet. ? Avoiding tobacco and drug use. ? Limiting alcohol use. ? Practicing safe sex. ? Taking low-dose aspirin every day starting at age 32. What happens during an annual well check? The services and screenings done by your health care  provider during your annual well check will depend on your age, overall health, lifestyle risk factors, and family history of disease. Counseling Your health care provider may ask you questions about your:  Alcohol use.  Tobacco use.  Drug use.  Emotional well-being.  Home and relationship well-being.  Sexual activity.  Eating habits.  Work and work Statistician.  Screening You may have the following tests or measurements:  Height, weight, and BMI.  Blood pressure.  Lipid and cholesterol levels. These may be checked every 5 years, or more frequently if you are over 55 years old.  Skin check.  Lung cancer screening. You may have this screening every year starting at age 37 if you have a 30-pack-year history of smoking and currently smoke or have quit within the past 15 years.  Fecal occult blood test (FOBT) of the stool. You may have this test every year starting at age 57.  Flexible sigmoidoscopy or colonoscopy. You may have a sigmoidoscopy every 5 years or a colonoscopy every 10 years starting at age 57.  Prostate cancer screening. Recommendations will vary depending on your family history and other risks.  Hepatitis C blood test.  Hepatitis B blood test.  Sexually transmitted disease (STD) testing.  Diabetes screening. This is done by checking your blood sugar (glucose) after you have not eaten for a while (fasting). You may have this done every 1-3 years.  Discuss your test results, treatment options, and if necessary, the need for more tests with your health care provider. Vaccines Your health care provider may recommend certain vaccines, such as:  Influenza vaccine. This is recommended every year.  Tetanus, diphtheria, and acellular pertussis (Tdap, Td) vaccine. You may need a Td booster every 10 years.  Varicella vaccine. You may need this if you have not been vaccinated.  Zoster vaccine. You may need this after age 71.  Measles, mumps, and rubella  (MMR) vaccine. You may need at least one dose of MMR if you were born in 1957 or later. You may also need a second dose.  Pneumococcal 13-valent conjugate (PCV13) vaccine. You may need this if you have certain conditions and have not been vaccinated.  Pneumococcal polysaccharide (PPSV23) vaccine. You may need one or two doses if you smoke cigarettes or if you have certain conditions.  Meningococcal vaccine. You may need this if you have certain conditions.  Hepatitis A vaccine. You may need this if you have certain conditions or if you travel or work in places where you may be exposed to hepatitis A.  Hepatitis B vaccine. You may need this if you have certain conditions or if you travel or work in places where you may be exposed to hepatitis B.  Haemophilus influenzae type b (Hib) vaccine. You may need this if you have certain risk factors.  Talk to your health care provider about which screenings and vaccines you need and how often you need them. This information is not intended to replace advice given to you by your health care provider. Make sure you discuss any questions you have with your health care provider. Document Released: 12/07/2015 Document Revised: 07/30/2016 Document Reviewed: 09/11/2015 Elsevier Interactive Patient Education  [employer].

## 2018-02-05 ENCOUNTER — Other Ambulatory Visit (INDEPENDENT_AMBULATORY_CARE_PROVIDER_SITE_OTHER): Payer: 59

## 2018-02-05 DIAGNOSIS — Z0001 Encounter for general adult medical examination with abnormal findings: Secondary | ICD-10-CM

## 2018-02-05 LAB — URINALYSIS, ROUTINE W REFLEX MICROSCOPIC
Bilirubin Urine: NEGATIVE
Hgb urine dipstick: NEGATIVE
Ketones, ur: NEGATIVE
LEUKOCYTES UA: NEGATIVE
Nitrite: NEGATIVE
PH: 6 (ref 5.0–8.0)
SPECIFIC GRAVITY, URINE: 1.02 (ref 1.000–1.030)
Total Protein, Urine: NEGATIVE
URINE GLUCOSE: NEGATIVE
UROBILINOGEN UA: 0.2 (ref 0.0–1.0)
WBC, UA: NONE SEEN (ref 0–?)

## 2018-02-05 LAB — PSA: PSA: 4.71 ng/mL — ABNORMAL HIGH (ref 0.10–4.00)

## 2018-02-10 ENCOUNTER — Other Ambulatory Visit: Payer: 59

## 2018-02-10 LAB — COMPREHENSIVE METABOLIC PANEL
AG Ratio: 1.4 (calc) (ref 1.0–2.5)
ALBUMIN MSPROF: 4 g/dL (ref 3.6–5.1)
ALT: 23 U/L (ref 9–46)
AST: 19 U/L (ref 10–35)
Alkaline phosphatase (APISO): 59 U/L (ref 40–115)
BUN: 17 mg/dL (ref 7–25)
CHLORIDE: 105 mmol/L (ref 98–110)
CO2: 27 mmol/L (ref 20–32)
Calcium: 9.2 mg/dL (ref 8.6–10.3)
Creat: 0.87 mg/dL (ref 0.70–1.33)
GLOBULIN: 2.8 g/dL (ref 1.9–3.7)
GLUCOSE: 89 mg/dL (ref 65–99)
POTASSIUM: 4.5 mmol/L (ref 3.5–5.3)
Sodium: 139 mmol/L (ref 135–146)
TOTAL PROTEIN: 6.8 g/dL (ref 6.1–8.1)
Total Bilirubin: 0.5 mg/dL (ref 0.2–1.2)

## 2018-02-10 LAB — CBC
HEMATOCRIT: 45.4 % (ref 39.0–52.0)
HEMOGLOBIN: 15.1 g/dL (ref 13.0–17.0)
MCHC: 33.4 g/dL (ref 30.0–36.0)
MCV: 91 fl (ref 78.0–100.0)
Platelets: 198 10*3/uL (ref 150.0–400.0)
RBC: 4.99 Mil/uL (ref 4.22–5.81)
RDW: 13.9 % (ref 11.5–15.5)
WBC: 7.9 10*3/uL (ref 4.0–10.5)

## 2018-02-10 NOTE — Addendum Note (Signed)
Addended by: Lynnea Ferrier on: 02/10/2018 02:53 PM   Modules accepted: Orders

## 2018-02-11 LAB — LIPID PANEL
CHOL/HDL RATIO: 4
Cholesterol: 204 mg/dL — ABNORMAL HIGH (ref 0–200)
HDL: 48.5 mg/dL (ref >39.00)
LDL Cholesterol: 126 mg/dL — ABNORMAL HIGH (ref 0–99)
NONHDL: 155.06
Triglycerides: 145 mg/dL (ref 0.0–149.0)
VLDL: 29 mg/dL (ref 0.0–40.0)

## 2018-02-18 IMAGING — CT CT ABD-PELV W/O CM
2 of 4 series · 17 of 46 positions shown, 19 images · non-contrast
Comparison: 03/30/2008

CLINICAL DATA: Previous ventral hernia repair. Sensation [DATE]
pouching.

EXAM:
CT ABDOMEN AND PELVIS WITHOUT CONTRAST
TECHNIQUE: Multidetector CT imaging of the abdomen and pelvis was performed
following the standard protocol without IV contrast.

[Series 2: axial st · axial · 0.98mm/px · z∈[+1077,+1482]mm · 14 of 91 slices shown, 16 images]
[im 5/91  soft-tissue]
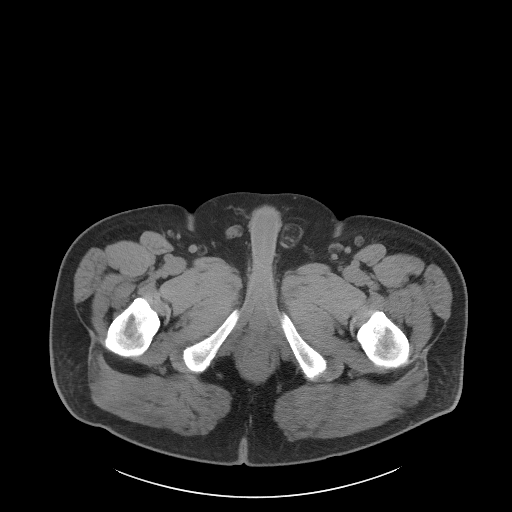
[im 5/91  bone]
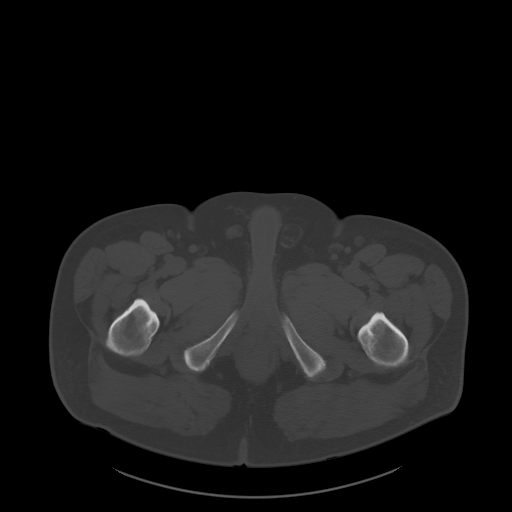
[im 13/91  soft-tissue]
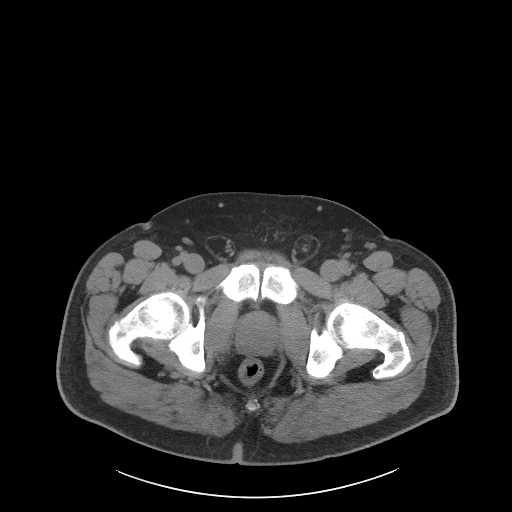
[im 18/91  soft-tissue]
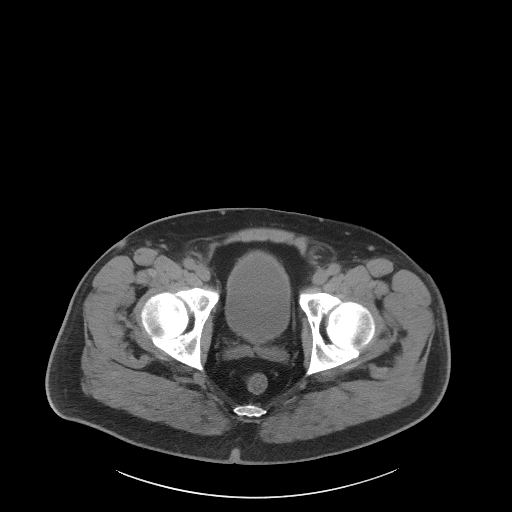
[im 26/91  soft-tissue]
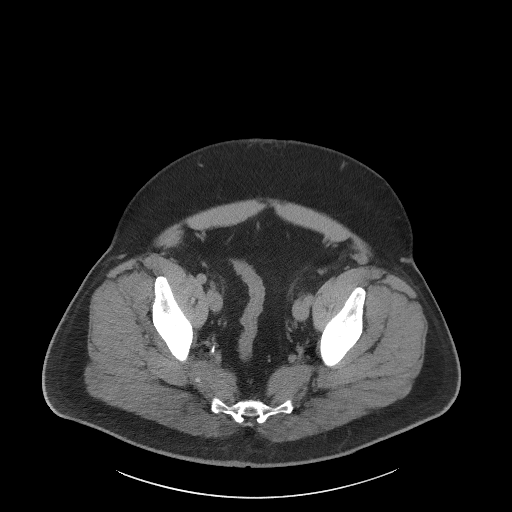
[im 31/91  soft-tissue]
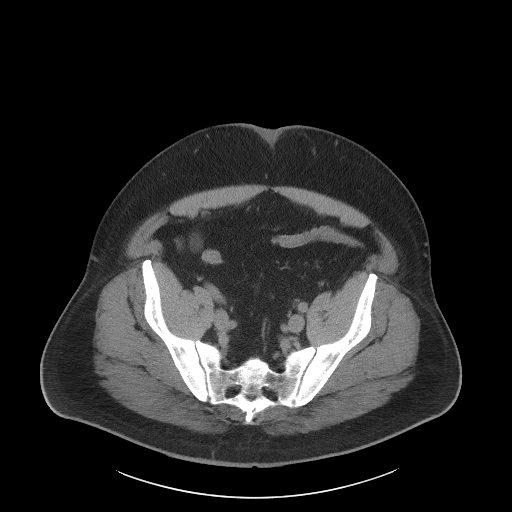
[im 35/91  soft-tissue]
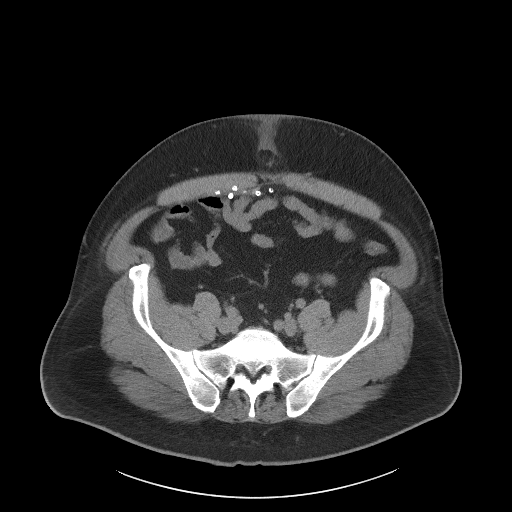
[im 43/91  soft-tissue]
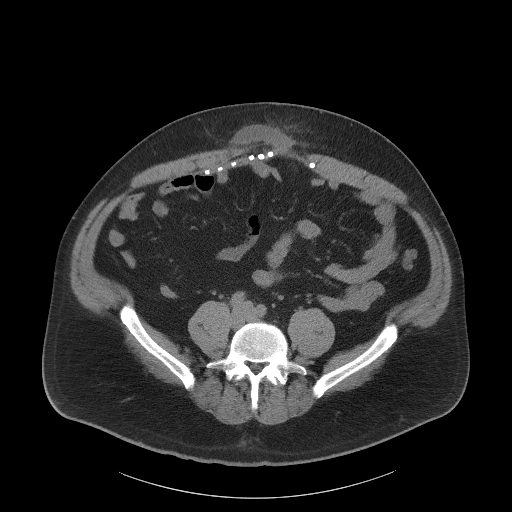
[im 48/91  soft-tissue]
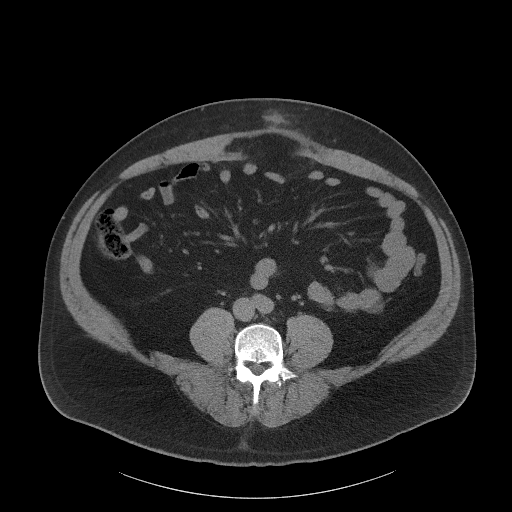
[im 56/91  soft-tissue]
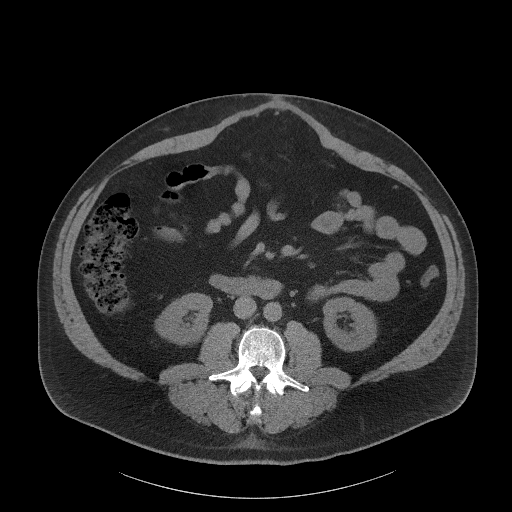
[im 56/91  bone]
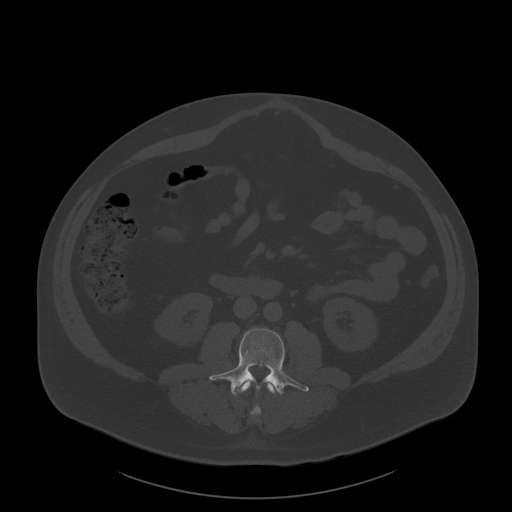
[im 61/91  soft-tissue]
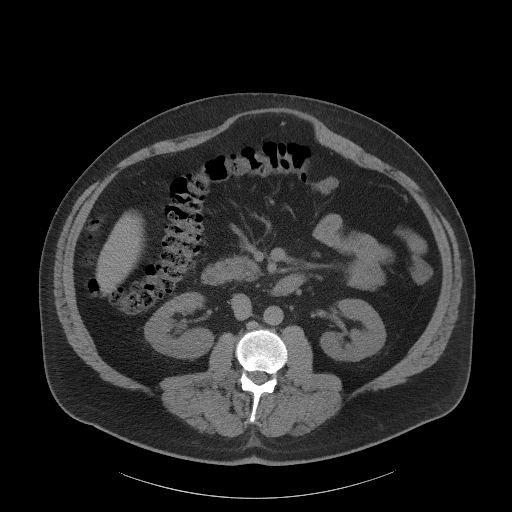
[im 69/91  soft-tissue]
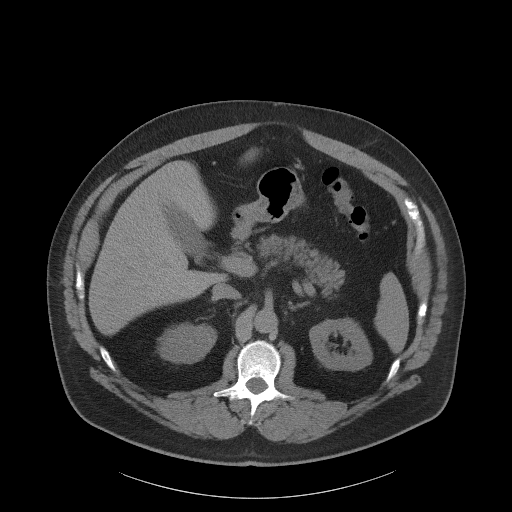
[im 73/91  soft-tissue]
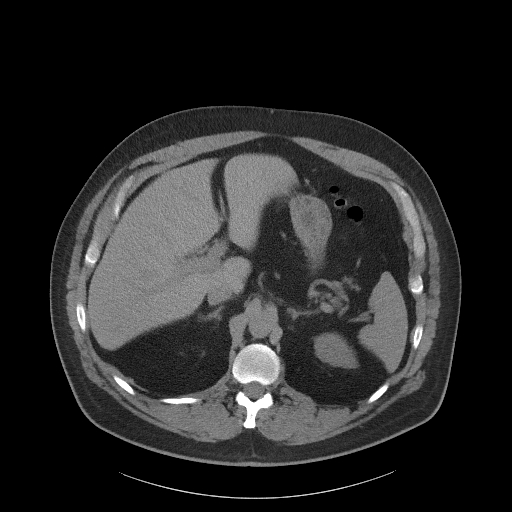
[im 78/91  soft-tissue]
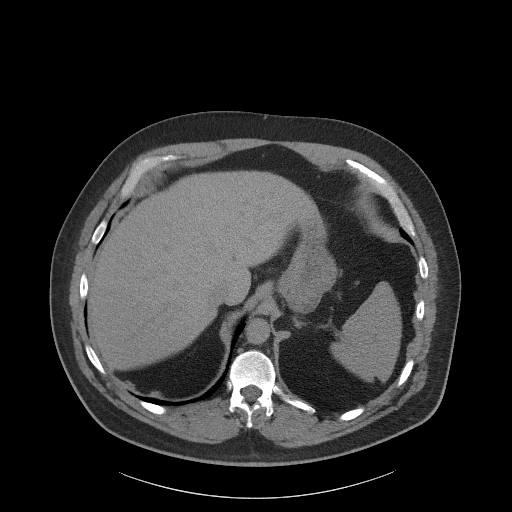
[im 86/91  soft-tissue]
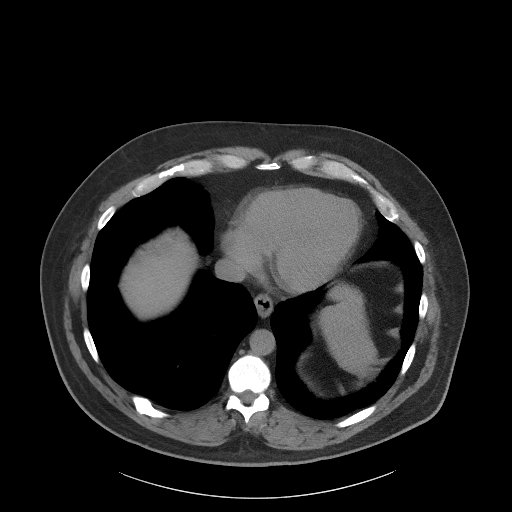

[Series 5: coronal st · coronal · 1.01mm/px · 3 of 126 slices shown]
[im 42/126  soft-tissue]
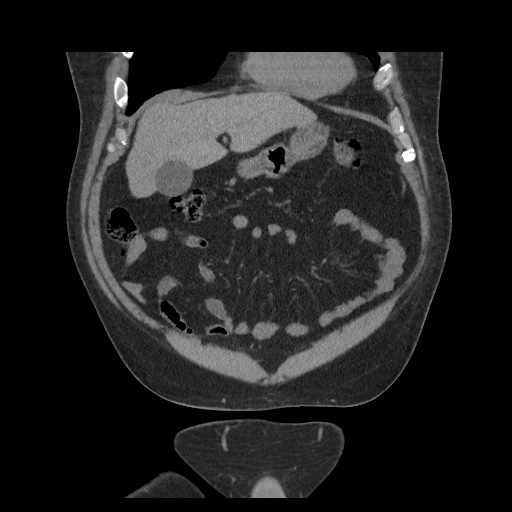
[im 56/126  soft-tissue]
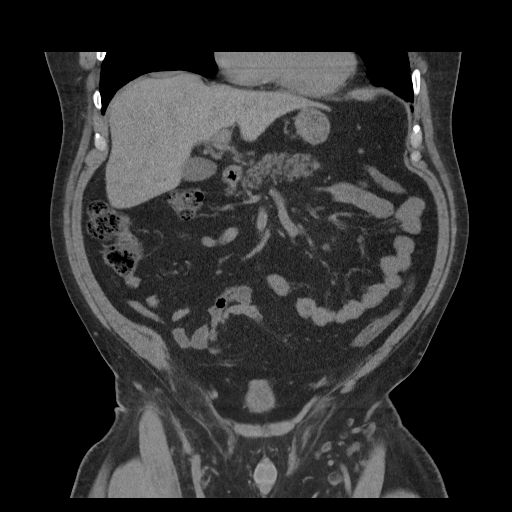
[im 70/126  soft-tissue]
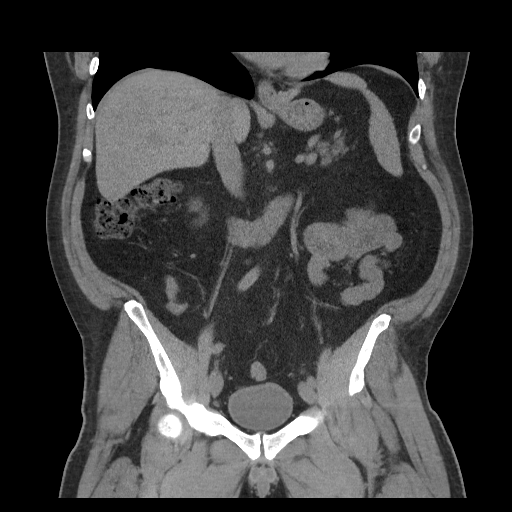

[17 of 46 positions shown; findings below may reference images not displayed]

FINDINGS: Lower chest: Normal

Hepatobiliary: Normal without contrast.  No calcified gallstones.

Pancreas: Normal

Spleen: Normal

Adrenals/Urinary Tract: Adrenal glands are normal. Kidneys are
normal. No cyst, mass, stone or hydronephrosis.

Stomach/Bowel: Normal

Vascular/Lymphatic: Aortic atherosclerosis. No aneurysm. IVC is
normal. No retroperitoneal adenopathy.

Reproductive: Normal

Other: No free fluid or air. Previous ventral hernia repair. There
are mesh markers in the periumbilical and supra umbilical region.
Anterior abdominal wall appears intact below that. Above the mesh
markers, there is apparently an anterior repair without radiopaque
markers. This area of repair bulges at outward by about 3 cm. The
area extends over the cephalo caudal length of 12 cm in shows
approximately 8 cm separation of the rectus muscles right to left.
Small fluid collections are noted in 2 locations anterior to the
sheath/repair.

Musculoskeletal: No spinal abnormality seen. No other muscular or
abdominal wall finding of note.
IMPRESSION: No evidence of abdominal organ pathology except mild aortic
atherosclerosis.

Previous ventral hernia repair. Mesh markers in the periumbilical to
supraumbilical region. Above that, there is anterior bulging of a
previous repair by about 3 cm. This area measures 12 cm cephalo
caudal and shows a 8 cm of right-to-left separation of the main
rectus bodies. The repair is intact, but does bulge anteriorly.

## 2018-02-19 DIAGNOSIS — R972 Elevated prostate specific antigen [PSA]: Secondary | ICD-10-CM | POA: Insufficient documentation

## 2018-02-19 NOTE — Addendum Note (Signed)
Addended by: Jon Billings on: 02/19/2018 04:29 PM   Modules accepted: Orders

## 2018-03-29 ENCOUNTER — Telehealth: Payer: Self-pay

## 2018-03-29 NOTE — Telephone Encounter (Signed)
Copied from [place] [id number]. Topic: Referral - Request >> Mar 29, 2018  3:37 PM Oliver Pila B wrote: Reason for CRM: pt called to let pcp know that he would like to go ahead and have a referral placed for his hernia b/c pt is having issues w/ his, call pt to advise

## 2018-03-30 NOTE — Addendum Note (Signed)
Addended by: Abelino Derrick A on: 03/30/2018 01:30 PM   Modules accepted: Orders

## 2018-03-30 NOTE — Telephone Encounter (Signed)
Dw patient. Have referred him to the [facility]. [facility], [employer] [facility].

## 2018-05-24 ENCOUNTER — Telehealth: Payer: Self-pay | Admitting: Family Medicine

## 2018-05-24 NOTE — Telephone Encounter (Signed)
I called and spoke with patient. He had some questions about FMLA since he's having to take off so much time for his appointments. I advised him that we do fill out FMLA paperwork, but it will probably be better for [facility] and [facility] to fill out the paperwork since they are the ones that are seeing him more frequently. Patient verbalized understanding and will get the forms to them. Patient goes back to [place] on 7/11. I did tell him to let us know if he needed [place] to fill out any forms, but they would most likely have to come from the doctors that he is seeing more frequently.

## 2018-05-24 NOTE — Telephone Encounter (Signed)
Hi Steven Zimmerman,  I just call [name] in reference to his referral we have for him going to [facility]. He states that his wife is taking him to [facility] next week on the 11th. But he needs for you to call him in reference to some refills, and some questions he has for you.  His number is [phone] and if you can call him after 2:30pm.  Thank you

## 2018-09-16 ENCOUNTER — Encounter: Payer: Self-pay | Admitting: Family Medicine

## 2018-09-16 ENCOUNTER — Ambulatory Visit (INDEPENDENT_AMBULATORY_CARE_PROVIDER_SITE_OTHER): Payer: 59 | Admitting: Family Medicine

## 2018-09-16 VITALS — BP 130/78 | HR 99 | Ht 70.0 in | Wt 261.4 lb

## 2018-09-16 DIAGNOSIS — Z7289 Other problems related to lifestyle: Secondary | ICD-10-CM | POA: Insufficient documentation

## 2018-09-16 DIAGNOSIS — R0683 Snoring: Secondary | ICD-10-CM | POA: Diagnosis not present

## 2018-09-16 DIAGNOSIS — E6609 Other obesity due to excess calories: Secondary | ICD-10-CM | POA: Diagnosis not present

## 2018-09-16 DIAGNOSIS — Z0001 Encounter for general adult medical examination with abnormal findings: Secondary | ICD-10-CM

## 2018-09-16 DIAGNOSIS — E559 Vitamin D deficiency, unspecified: Secondary | ICD-10-CM

## 2018-09-16 DIAGNOSIS — E66812 Obesity, class 2: Secondary | ICD-10-CM | POA: Insufficient documentation

## 2018-09-16 DIAGNOSIS — Z1321 Encounter for screening for nutritional disorder: Secondary | ICD-10-CM | POA: Diagnosis not present

## 2018-09-16 DIAGNOSIS — K429 Umbilical hernia without obstruction or gangrene: Secondary | ICD-10-CM

## 2018-09-16 DIAGNOSIS — F1090 Alcohol use, unspecified, uncomplicated: Secondary | ICD-10-CM | POA: Insufficient documentation

## 2018-09-16 DIAGNOSIS — Z6837 Body mass index (BMI) 37.0-37.9, adult: Secondary | ICD-10-CM | POA: Diagnosis not present

## 2018-09-16 DIAGNOSIS — K439 Ventral hernia without obstruction or gangrene: Secondary | ICD-10-CM

## 2018-09-16 DIAGNOSIS — Z789 Other specified health status: Secondary | ICD-10-CM | POA: Insufficient documentation

## 2018-09-16 NOTE — Progress Notes (Addendum)
Subjective:  Patient ID: Steven Zimmerman, male    DOB: 1961/09/03  Age: 57 y.o. MRN: [id number]  CC: Discuss (weight loss options)   HPI Steven Zimmerman presents for presents for follow-up status post consultation with general surgeon.  Patient is in need of repair of extensive ventral hernia.  Apparently the surgeon had shown him his CT scan that had shown extensive omental adipose tissue and advised him that weight loss would be a prerequisite to the surgery.  Patient had has been trying to lose weight on his own with exercise and healthy eating.  He indicates that he is walking for at least 30 minutes at least 5 days a week.  He has increased fruits and vegetables in his diet.  He he admits to drinking up to 12 beers in a given setting on the weekends.  He quit smoking altogether back in June.  He does snore and wakes up through the night.  His wife says that his snoring is loud.  It was unclear whether or not he feels rested in the mornings.  He is under surveillance with urology for PSA velocity.  Status post colonoscopy in 2017.  His mother had colon cancer and his father had esophageal cancer.  Patient is not fasting today.  He lives with his wife.  Outpatient Medications Prior to Visit  Medication Sig Dispense Refill  . ibuprofen (ADVIL,MOTRIN) 200 MG tablet Take 200 mg by mouth every 6 (six) hours as needed.    . ranitidine (ZANTAC) 150 MG tablet Take 150 mg by mouth daily as needed for heartburn.    Marland Kitchen oxyCODONE-acetaminophen (ROXICET) 5-325 MG tablet Take 1-2 tablets by mouth every 4 (four) hours as needed. (Patient not taking: Reported on 01/18/2018) 30 tablet 0  . 0.9 %  sodium chloride infusion      No facility-administered medications prior to visit.     ROS Review of Systems  Constitutional: Negative for chills, diaphoresis, fatigue, fever and unexpected weight change.  Respiratory: Negative.   Cardiovascular: Negative.   Gastrointestinal: Positive for abdominal distention.  Negative for anal bleeding and blood in stool.  Genitourinary: Negative.   Skin: Negative for rash.  Allergic/Immunologic: Negative for immunocompromised state.  Neurological: Negative for speech difficulty, light-headedness and numbness.  Hematological: Does not bruise/bleed easily.  Psychiatric/Behavioral: Negative.     Objective:  BP 130/78   Pulse 99   Ht 5\' 10"  (1.778 m)   Wt 261 lb 6 oz (118.6 kg)   SpO2 96%   BMI 37.50 kg/m   BP Readings from Last 3 Encounters:  09/16/18 130/78  01/18/18 110/78  01/15/17 119/78    Wt Readings from Last 3 Encounters:  09/16/18 261 lb 6 oz (118.6 kg)  01/18/18 248 lb 12.8 oz (112.9 kg)  01/15/17 258 lb (117 kg)    Physical Exam  Constitutional: He is oriented to person, place, and time. He appears well-developed and well-nourished. No distress.  HENT:  Head: Normocephalic and atraumatic.  Right Ear: External ear normal.  Left Ear: External ear normal.  Mouth/Throat: Oropharynx is clear and moist. No oropharyngeal exudate.  Eyes: Pupils are equal, round, and reactive to light. Conjunctivae and EOM are normal. Right eye exhibits no discharge. Left eye exhibits no discharge. No scleral icterus.  Neck: Neck supple. No JVD present. No tracheal deviation present. No thyromegaly present.  Cardiovascular: Normal rate, regular rhythm and normal heart sounds.  Pulmonary/Chest: Effort normal and breath sounds normal.  Abdominal: Soft. Bowel sounds are  normal. He exhibits distension.  Lymphadenopathy:    He has no cervical adenopathy.  Neurological: He is alert and oriented to person, place, and time.  Skin: Skin is warm and dry. He is not diaphoretic.  Psychiatric: He has a normal mood and affect. His behavior is normal.    Lab Results  Component Value Date   WBC 8.0 09/16/2018   HGB 15.4 09/16/2018   HCT 45.7 09/16/2018   PLT 196.0 09/16/2018   GLUCOSE 88 09/16/2018   CHOL 204 (H) 02/10/2018   TRIG 145.0 02/10/2018   HDL 48.50  02/10/2018   LDLDIRECT 139.0 09/16/2018   LDLCALC 126 (H) 02/10/2018   ALT 24 09/16/2018   AST 22 09/16/2018   NA 139 09/16/2018   K 4.2 09/16/2018   CL 105 09/16/2018   CREATININE 1.04 09/16/2018   BUN 22 09/16/2018   CO2 26 09/16/2018   TSH 1.18 09/16/2018   PSA 4.71 (H) 02/05/2018    Ct Abdomen Pelvis Wo Contrast  Result Date: 04/10/2017 CLINICAL DATA:  Previous ventral hernia repair. Sensation of out pouching. EXAM: CT ABDOMEN AND PELVIS WITHOUT CONTRAST TECHNIQUE: Multidetector CT imaging of the abdomen and pelvis was performed following the standard protocol without IV contrast. COMPARISON:  03/30/2008 FINDINGS: Lower chest: Normal Hepatobiliary: Normal without contrast.  No calcified gallstones. Pancreas: Normal Spleen: Normal Adrenals/Urinary Tract: Adrenal glands are normal. Kidneys are normal. No cyst, mass, stone or hydronephrosis. Stomach/Bowel: Normal Vascular/Lymphatic: Aortic atherosclerosis. No aneurysm. IVC is normal. No retroperitoneal adenopathy. Reproductive: Normal Other: No free fluid or air. Previous ventral hernia repair. There are mesh markers in the periumbilical and supra umbilical region. Anterior abdominal wall appears intact below that. Above the mesh markers, there is apparently an anterior repair without radiopaque markers. This area of repair bulges at outward by about 3 cm. The area extends over the cephalo caudal length of 12 cm in shows approximately 8 cm separation of the rectus muscles right to left. Small fluid collections are noted in 2 locations anterior to the sheath/repair. Musculoskeletal: No spinal abnormality seen. No other muscular or abdominal wall finding of note. IMPRESSION: No evidence of abdominal organ pathology except mild aortic atherosclerosis. Previous ventral hernia repair. Mesh markers in the periumbilical to supraumbilical region. Above that, there is anterior bulging of a previous repair by about 3 cm. This area measures 12 cm cephalo  caudal and shows a 8 cm of right-to-left separation of the main rectus bodies. The repair is intact, but does bulge anteriorly. Electronically Signed   By: Nelson Chimes M.D.   On: 04/10/2017 09:18    Assessment & Plan:   Shirley was seen today for discuss.  Diagnoses and all orders for this visit:  Class 2 obesity due to excess calories with body mass index (BMI) of 37.0 to 37.9 in adult, unspecified whether serious comorbidity present -     CBC -     Comprehensive metabolic panel -     TSH -     Urinalysis, Routine w reflex microscopic -     Amb Ref to Medical Weight Management  Encounter for vitamin deficiency screening -     VITAMIN D 25 Hydroxy (Vit-D Deficiency, Fractures)  Encounter for health maintenance examination with abnormal findings -     CBC -     Comprehensive metabolic panel -     LDL cholesterol, direct -     Urinalysis, Routine w reflex microscopic  Snores -     Ambulatory referral to Sleep Studies  Ventral hernia without obstruction or gangrene  Umbilical hernia without obstruction and without gangrene  Alcohol use  Vitamin D deficiency -     Vitamin D, Ergocalciferol, (DRISDOL) 50000 units CAPS capsule; Take 1 capsule (50,000 Units total) by mouth every 7 (seven) days.   I have discontinued [place] oxyCODONE-acetaminophen. I am also having him start on Vitamin D (Ergocalciferol). Additionally, I am having him maintain his ibuprofen and ranitidine. We will stop administering sodium chloride.  Meds ordered this encounter  Medications  . Vitamin D, Ergocalciferol, (DRISDOL) 50000 units CAPS capsule    Sig: Take 1 capsule (50,000 Units total) by mouth every 7 (seven) days.    Dispense:  30 capsule    Refill:  0   Nonfasting labs drawn today with referrals for sleep study and weight loss management.  He tells me that he can quit drinking altogether and I advised him to do so.  Patient was given information on health maintenance and disease  prevention.  Patient was given information on exercising to lose weight as well as calorie counting for weight loss.  Advised to follow-up in 3 months.  Follow-up: Return in about 3 months (around 12/17/2018).  Libby Maw, MD

## 2018-09-16 NOTE — Patient Instructions (Addendum)
Exercising to Lose Weight Exercising can help you to lose weight. In order to lose weight through exercise, you need to do vigorous-intensity exercise. You can tell that you are exercising with vigorous intensity if you are breathing very hard and fast and cannot hold a conversation while exercising. Moderate-intensity exercise helps to maintain your current weight. You can tell that you are exercising at a moderate level if you have a higher heart rate and faster breathing, but you are still able to hold a conversation. How often should I exercise? Choose an activity that you enjoy and set realistic goals. Your health care provider can help you to make an activity plan that works for you. Exercise regularly as directed by your health care provider. This may include:  Doing resistance training twice each week, such as: ? Push-ups. ? Sit-ups. ? Lifting weights. ? Using resistance bands.  Doing a given intensity of exercise for a given amount of time. Choose from these options: ? 150 minutes of moderate-intensity exercise every week. ? 75 minutes of vigorous-intensity exercise every week. ? A mix of moderate-intensity and vigorous-intensity exercise every week.  Children, pregnant women, people who are out of shape, people who are overweight, and older adults may need to consult a health care provider for individual recommendations. If you have any sort of medical condition, be sure to consult your health care provider before starting a new exercise program. What are some activities that can help me to lose weight?  Walking at a rate of at least 4.5 miles an hour.  Jogging or running at a rate of 5 miles per hour.  Biking at a rate of at least 10 miles per hour.  Lap swimming.  Roller-skating or in-line skating.  Cross-country skiing.  Vigorous competitive sports, such as football, basketball, and soccer.  Jumping rope.  Aerobic dancing. How can I be more active in my day-to-day  activities?  Use the stairs instead of the elevator.  Take a walk during your lunch break.  If you drive, park your car farther away from work or school.  If you take public transportation, get off one stop early and walk the rest of the way.  Make all of your phone calls while standing up and walking around.  Get up, stretch, and walk around every 30 minutes throughout the day. What guidelines should I follow while exercising?  Do not exercise so much that you hurt yourself, feel dizzy, or get very short of breath.  Consult your health care provider prior to starting a new exercise program.  Wear comfortable clothes and shoes with good support.  Drink plenty of water while you exercise to prevent dehydration or heat stroke. Body water is lost during exercise and must be replaced.  Work out until you breathe faster and your heart beats faster. This information is not intended to replace advice given to you by your health care provider. Make sure you discuss any questions you have with your health care provider. Document Released: 12/13/2010 Document Revised: 04/17/2016 Document Reviewed: 04/13/2014 Elsevier Interactive Patient Education  2018 [place] [employer], Male A healthy lifestyle and preventive care is important for your health and wellness. Ask your health care provider about what schedule of regular examinations is right for you. What should I know about weight and diet? Eat a Healthy Diet  Eat plenty of vegetables, fruits, whole grains, low-fat dairy products, and lean protein.  Do not eat a lot of foods high in solid fats,  added sugars, or salt.  Maintain a Healthy Weight Regular exercise can help you achieve or maintain a healthy weight. You should:  Do at least 150 minutes of exercise each week. The exercise should increase your heart rate and make you sweat (moderate-intensity exercise).  Do strength-training exercises at least twice a  week.  Watch Your Levels of Cholesterol and Blood Lipids  Have your blood tested for lipids and cholesterol every 5 years starting at 56 years of age. If you are at high risk for heart disease, you should start having your blood tested when you are 56 years old. You may need to have your cholesterol levels checked more often if: ? Your lipid or cholesterol levels are high. ? You are older than 56 years of age. ? You are at high risk for heart disease.  What should I know about cancer screening? Many types of cancers can be detected early and may often be prevented. Lung Cancer  You should be screened every year for lung cancer if: ? You are a current smoker who has smoked for at least 30 years. ? You are a former smoker who has quit within the past 15 years.  Talk to your health care provider about your screening options, when you should start screening, and how often you should be screened.  Colorectal Cancer  Routine colorectal cancer screening usually begins at 56 years of age and should be repeated every 5-10 years until you are 56 years old. You may need to be screened more often if early forms of precancerous polyps or small growths are found. Your health care provider may recommend screening at an earlier age if you have risk factors for colon cancer.  Your health care provider may recommend using home test kits to check for hidden blood in the stool.  A small camera at the end of a tube can be used to examine your colon (sigmoidoscopy or colonoscopy). This checks for the earliest forms of colorectal cancer.  Prostate and Testicular Cancer  Depending on your age and overall health, your health care provider may do certain tests to screen for prostate and testicular cancer.  Talk to your health care provider about any symptoms or concerns you have about testicular or prostate cancer.  Skin Cancer  Check your skin from head to toe regularly.  Tell your health care provider  about any new moles or changes in moles, especially if: ? There is a change in a mole's size, shape, or color. ? You have a mole that is larger than a pencil eraser.  Always use sunscreen. Apply sunscreen liberally and repeat throughout the day.  Protect yourself by wearing long sleeves, pants, a wide-brimmed hat, and sunglasses when outside.  What should I know about heart disease, diabetes, and high blood pressure?  If you are 19-65 years of age, have your blood pressure checked every 3-5 years. If you are 36 years of age or older, have your blood pressure checked every year. You should have your blood pressure measured twice-once when you are at a hospital or clinic, and once when you are not at a hospital or clinic. Record the average of the two measurements. To check your blood pressure when you are not at a hospital or clinic, you can use: ? An automated blood pressure machine at a pharmacy. ? A home blood pressure monitor.  Talk to your health care provider about your target blood pressure.  If you are between 30-34 years old, ask  your health care provider if you should take aspirin to prevent heart disease.  Have regular diabetes screenings by checking your fasting blood sugar level. ? If you are at a normal weight and have a low risk for diabetes, have this test once every three years after the age of 47. ? If you are overweight and have a high risk for diabetes, consider being tested at a younger age or more often.  A one-time screening for abdominal aortic aneurysm (AAA) by ultrasound is recommended for men aged 69-75 years who are current or former smokers. What should I know about preventing infection? Hepatitis B If you have a higher risk for hepatitis B, you should be screened for this virus. Talk with your health care provider to find out if you are at risk for hepatitis B infection. Hepatitis C Blood testing is recommended for:  Everyone born from [birth year] through  1964.  Anyone with known risk factors for hepatitis C.  Sexually Transmitted Diseases (STDs)  You should be screened each year for STDs including gonorrhea and chlamydia if: ? You are sexually active and are younger than 56 years of age. ? You are older than 56 years of age and your health care provider tells you that you are at risk for this type of infection. ? Your sexual activity has changed since you were last screened and you are at an increased risk for chlamydia or gonorrhea. Ask your health care provider if you are at risk.  Talk with your health care provider about whether you are at high risk of being infected with HIV. Your health care provider may recommend a prescription medicine to help prevent HIV infection.  What else can I do?  Schedule regular health, dental, and eye exams.  Stay current with your vaccines (immunizations).  Do not use any tobacco products, such as cigarettes, chewing tobacco, and e-cigarettes. If you need help quitting, ask your health care provider.  Limit alcohol intake to no more than 2 drinks per day. One drink equals 12 ounces of beer, 5 ounces of wine, or 1 ounces of hard liquor.  Do not use street drugs.  Do not share needles.  Ask your health care provider for help if you need support or information about quitting drugs.  Tell your health care provider if you often feel depressed.  Tell your health care provider if you have ever been abused or do not feel safe at home. This information is not intended to replace advice given to you by your health care provider. Make sure you discuss any questions you have with your health care provider. Document Released: 05/08/2008 Document Revised: 07/09/2016 Document Reviewed: 08/14/2015 Elsevier Interactive Patient Education  2018 [place] [employer] Calories are units of energy. Your body needs a certain amount of calories from food to keep you going throughout the  day. When you eat more calories than your body needs, your body stores the extra calories as fat. When you eat fewer calories than your body needs, your body burns fat to get the energy it needs. Calorie counting means keeping track of how many calories you eat and drink each day. Calorie counting can be helpful if you need to lose weight. If you make sure to eat fewer calories than your body needs, you should lose weight. Ask your health care provider what a healthy weight is for you. For calorie counting to work, you will need to eat the right number of calories  in a day in order to lose a healthy amount of weight per week. A dietitian can help you determine how many calories you need in a day and will give you suggestions on how to reach your calorie goal.  A healthy amount of weight to lose per week is usually 1-2 lb (0.5-0.9 kg). This usually means that your daily calorie intake should be reduced by 500-750 calories.  Eating 1,200 - 1,500 calories per day can help most women lose weight.  Eating 1,500 - 1,800 calories per day can help most men lose weight.  What is my plan? My goal is to have __________ calories per day. If I have this many calories per day, I should lose around __________ pounds per week. What do I need to know about calorie counting? In order to meet your daily calorie goal, you will need to:  Find out how many calories are in each food you would like to eat. Try to do this before you eat.  Decide how much of the food you plan to eat.  Write down what you ate and how many calories it had. Doing this is called keeping a food log.  To successfully lose weight, it is important to balance calorie counting with a healthy lifestyle that includes regular activity. Aim for 150 minutes of moderate exercise (such as walking) or 75 minutes of vigorous exercise (such as running) each week. Where do I find calorie information?  The number of calories in a food can be found on a  Nutrition Facts label. If a food does not have a Nutrition Facts label, try to look up the calories online or ask your dietitian for help. Remember that calories are listed per serving. If you choose to have more than one serving of a food, you will have to multiply the calories per serving by the amount of servings you plan to eat. For example, the label on a package of bread might say that a serving size is 1 slice and that there are 90 calories in a serving. If you eat 1 slice, you will have eaten 90 calories. If you eat 2 slices, you will have eaten 180 calories. How do I keep a food log? Immediately after each meal, record the following information in your food log:  What you ate. Don't forget to include toppings, sauces, and other extras on the food.  How much you ate. This can be measured in cups, ounces, or number of items.  How many calories each food and drink had.  The total number of calories in the meal.  Keep your food log near you, such as in a small notebook in your pocket, or use a mobile app or website. Some programs will calculate calories for you and show you how many calories you have left for the day to meet your goal. What are some calorie counting tips?  Use your calories on foods and drinks that will fill you up and not leave you hungry: ? Some examples of foods that fill you up are nuts and nut butters, vegetables, lean proteins, and high-fiber foods like whole grains. High-fiber foods are foods with more than 5 g fiber per serving. ? Drinks such as sodas, specialty coffee drinks, alcohol, and juices have a lot of calories, yet do not fill you up.  Eat nutritious foods and avoid empty calories. Empty calories are calories you get from foods or beverages that do not have many vitamins or protein, such as candy,  sweets, and soda. It is better to have a nutritious high-calorie food (such as an avocado) than a food with few nutrients (such as a bag of chips).  Know how  many calories are in the foods you eat most often. This will help you calculate calorie counts faster.  Pay attention to calories in drinks. Low-calorie drinks include water and unsweetened drinks.  Pay attention to nutrition labels for "low fat" or "fat free" foods. These foods sometimes have the same amount of calories or more calories than the full fat versions. They also often have added sugar, starch, or salt, to make up for flavor that was removed with the fat.  Find a way of tracking calories that works for you. Get creative. Try different apps or programs if writing down calories does not work for you. What are some portion control tips?  Know how many calories are in a serving. This will help you know how many servings of a certain food you can have.  Use a measuring cup to measure serving sizes. You could also try weighing out portions on a kitchen scale. With time, you will be able to estimate serving sizes for some foods.  Take some time to put servings of different foods on your favorite plates, bowls, and cups so you know what a serving looks like.  Try not to eat straight from a bag or box. Doing this can lead to overeating. Put the amount you would like to eat in a cup or on a plate to make sure you are eating the right portion.  Use smaller plates, glasses, and bowls to prevent overeating.  Try not to multitask (for example, watch TV or use your computer) while eating. If it is time to eat, sit down at a table and enjoy your food. This will help you to know when you are full. It will also help you to be aware of what you are eating and how much you are eating. What are tips for following this plan? Reading food labels  Check the calorie count compared to the serving size. The serving size may be smaller than what you are used to eating.  Check the source of the calories. Make sure the food you are eating is high in vitamins and protein and low in saturated and trans  fats. Shopping  Read nutrition labels while you shop. This will help you make healthy decisions before you decide to purchase your food.  Make a grocery list and stick to it. Cooking  Try to cook your favorite foods in a healthier way. For example, try baking instead of frying.  Use low-fat dairy products. Meal planning  Use more fruits and vegetables. Half of your plate should be fruits and vegetables.  Include lean proteins like poultry and fish. How do I count calories when eating out?  Ask for smaller portion sizes.  Consider sharing an entree and sides instead of getting your own entree.  If you get your own entree, eat only half. Ask for a box at the beginning of your meal and put the rest of your entree in it so you are not tempted to eat it.  If calories are listed on the menu, choose the lower calorie options.  Choose dishes that include vegetables, fruits, whole grains, low-fat dairy products, and lean protein.  Choose items that are boiled, broiled, grilled, or steamed. Stay away from items that are buttered, battered, fried, or served with cream sauce. Items labeled "crispy" are  usually fried, unless stated otherwise.  Choose water, low-fat milk, unsweetened iced tea, or other drinks without added sugar. If you want an alcoholic beverage, choose a lower calorie option such as a glass of wine or light beer.  Ask for dressings, sauces, and syrups on the side. These are usually high in calories, so you should limit the amount you eat.  If you want a salad, choose a garden salad and ask for grilled meats. Avoid extra toppings like bacon, cheese, or fried items. Ask for the dressing on the side, or ask for olive oil and vinegar or lemon to use as dressing.  Estimate how many servings of a food you are given. For example, a serving of cooked rice is  cup or about the size of half a baseball. Knowing serving sizes will help you be aware of how much food you are eating at  restaurants. The list below tells you how big or small some common portion sizes are based on everyday objects: ? 1 oz-4 stacked dice. ? 3 oz-1 deck of cards. ? 1 tsp-1 die. ? 1 Tbsp- a ping-pong ball. ? 2 Tbsp-1 ping-pong ball. ?  cup- baseball. ? 1 cup-1 baseball. Summary  Calorie counting means keeping track of how many calories you eat and drink each day. If you eat fewer calories than your body needs, you should lose weight.  A healthy amount of weight to lose per week is usually 1-2 lb (0.5-0.9 kg). This usually means reducing your daily calorie intake by 500-750 calories.  The number of calories in a food can be found on a Nutrition Facts label. If a food does not have a Nutrition Facts label, try to look up the calories online or ask your dietitian for help.  Use your calories on foods and drinks that will fill you up, and not on foods and drinks that will leave you hungry.  Use smaller plates, glasses, and bowls to prevent overeating. This information is not intended to replace advice given to you by your health care provider. Make sure you discuss any questions you have with your health care provider. Document Released: 11/10/2005 Document Revised: 10/10/2016 Document Reviewed: 10/10/2016 Elsevier Interactive Patient Education  [employer].

## 2018-09-17 LAB — URINALYSIS, ROUTINE W REFLEX MICROSCOPIC
BILIRUBIN URINE: NEGATIVE
Hgb urine dipstick: NEGATIVE
Ketones, ur: NEGATIVE
Leukocytes, UA: NEGATIVE
Nitrite: NEGATIVE
PH: 7 (ref 5.0–8.0)
RBC / HPF: NONE SEEN (ref 0–?)
Specific Gravity, Urine: 1.02 (ref 1.000–1.030)
TOTAL PROTEIN, URINE-UPE24: NEGATIVE
UROBILINOGEN UA: 0.2 (ref 0.0–1.0)
Urine Glucose: NEGATIVE

## 2018-09-17 LAB — LDL CHOLESTEROL, DIRECT: Direct LDL: 139 mg/dL

## 2018-09-17 LAB — COMPREHENSIVE METABOLIC PANEL
ALK PHOS: 53 U/L (ref 39–117)
ALT: 24 U/L (ref 0–53)
AST: 22 U/L (ref 0–37)
Albumin: 4.2 g/dL (ref 3.5–5.2)
BUN: 22 mg/dL (ref 6–23)
CHLORIDE: 105 meq/L (ref 96–112)
CO2: 26 meq/L (ref 19–32)
Calcium: 9.2 mg/dL (ref 8.4–10.5)
Creatinine, Ser: 1.04 mg/dL (ref 0.40–1.50)
GFR: 78.43 mL/min (ref >60.00)
GLUCOSE: 88 mg/dL (ref 70–99)
POTASSIUM: 4.2 meq/L (ref 3.5–5.1)
Sodium: 139 mEq/L (ref 135–145)
TOTAL PROTEIN: 6.9 g/dL (ref 6.0–8.3)
Total Bilirubin: 0.5 mg/dL (ref 0.2–1.2)

## 2018-09-17 LAB — CBC
HEMATOCRIT: 45.7 % (ref 39.0–52.0)
HEMOGLOBIN: 15.4 g/dL (ref 13.0–17.0)
MCHC: 33.7 g/dL (ref 30.0–36.0)
MCV: 89.8 fl (ref 78.0–100.0)
Platelets: 196 10*3/uL (ref 150.0–400.0)
RBC: 5.09 Mil/uL (ref 4.22–5.81)
RDW: 13.8 % (ref 11.5–15.5)
WBC: 8 10*3/uL (ref 4.0–10.5)

## 2018-09-17 LAB — VITAMIN D 25 HYDROXY (VIT D DEFICIENCY, FRACTURES): VITD: 27.89 ng/mL — ABNORMAL LOW (ref 30.00–100.00)

## 2018-09-17 LAB — TSH: TSH: 1.18 u[IU]/mL (ref 0.35–4.50)

## 2018-09-17 MED ORDER — VITAMIN D (ERGOCALCIFEROL) 1.25 MG (50000 UNIT) PO CAPS: 50000.0000 [IU] | ORAL_CAPSULE | ORAL | 0 refills | Status: DC

## 2018-09-17 NOTE — Addendum Note (Signed)
Addended by: Jon Billings on: 09/17/2018 04:23 PM   Modules accepted: Orders

## 2018-12-16 ENCOUNTER — Other Ambulatory Visit: Payer: Self-pay | Admitting: Family Medicine

## 2018-12-16 DIAGNOSIS — E559 Vitamin D deficiency, unspecified: Secondary | ICD-10-CM

## 2019-03-22 ENCOUNTER — Telehealth: Payer: Self-pay | Admitting: Family Medicine

## 2019-03-22 NOTE — Telephone Encounter (Signed)
I called and left message on patient voicemail to call office and schedule follow up appointment with Dr. Ethelene Hal.

## 2019-06-06 ENCOUNTER — Ambulatory Visit (INDEPENDENT_AMBULATORY_CARE_PROVIDER_SITE_OTHER): Payer: 59 | Admitting: Bariatrics

## 2019-06-06 ENCOUNTER — Encounter (INDEPENDENT_AMBULATORY_CARE_PROVIDER_SITE_OTHER): Payer: Self-pay | Admitting: Bariatrics

## 2019-06-06 ENCOUNTER — Other Ambulatory Visit: Payer: Self-pay

## 2019-06-06 VITALS — BP 113/77 | HR 76 | Temp 97.9°F | Ht 69.0 in | Wt 265.0 lb

## 2019-06-06 DIAGNOSIS — R0602 Shortness of breath: Secondary | ICD-10-CM | POA: Diagnosis not present

## 2019-06-06 DIAGNOSIS — R5383 Other fatigue: Secondary | ICD-10-CM

## 2019-06-06 DIAGNOSIS — F3289 Other specified depressive episodes: Secondary | ICD-10-CM | POA: Diagnosis not present

## 2019-06-06 DIAGNOSIS — E538 Deficiency of other specified B group vitamins: Secondary | ICD-10-CM

## 2019-06-06 DIAGNOSIS — E559 Vitamin D deficiency, unspecified: Secondary | ICD-10-CM | POA: Diagnosis not present

## 2019-06-06 DIAGNOSIS — Z6839 Body mass index (BMI) 39.0-39.9, adult: Secondary | ICD-10-CM

## 2019-06-06 DIAGNOSIS — K219 Gastro-esophageal reflux disease without esophagitis: Secondary | ICD-10-CM

## 2019-06-06 DIAGNOSIS — Z0289 Encounter for other administrative examinations: Secondary | ICD-10-CM

## 2019-06-06 DIAGNOSIS — Z9189 Other specified personal risk factors, not elsewhere classified: Secondary | ICD-10-CM

## 2019-06-06 NOTE — Progress Notes (Signed)
Office: [phone]  /  Fax: [phone]   Dear Dr. Ethelene Hal,   Thank you for referring Steven Zimmerman to our clinic. The following note includes my evaluation and treatment recommendations.  HPI:   Chief Complaint: OBESITY    Steven Zimmerman has been referred by Steven Maw, MD for consultation regarding his obesity and obesity related comorbidities.    Steven Zimmerman (MR# [id number]) is a 59 y.o. male who presents on 06/06/2019 for obesity evaluation and treatment. Current BMI is Body mass index is 39.13 kg/m.Steven Zimmerman has been struggling with his weight for many years and has been unsuccessful in either losing weight, maintaining weight loss, or reaching his healthy weight goal. He has a ventral hernia, and needs a repair. He needs to lose weight first.     Steven Zimmerman attended our information session and states he is currently in the action stage of change and ready to dedicate time achieving and maintaining a healthier weight. Steven Zimmerman is interested in becoming our patient and working on intensive lifestyle modifications including (but not limited to) diet, exercise and weight loss.    Steven Zimmerman states his family eats meals together he thinks his family will eat healthier with him his desired weight loss is 75 lbs. he has been heavy most of  his life he started gaining weight in his later years his heaviest weight ever was 270 lbs. he likes to cook he craves pasta, pizza and carbohydrates he snacks frequently in the evenings he is frequently drinking liquids with calories he has problems with excessive hunger  he frequently eats larger portions than normal  he has binge eating behaviors he struggles with emotional eating    Fatigue [name] feels his energy is lower than it should be. This has worsened with weight gain and has not worsened recently. Steven Zimmerman admits to daytime somnolence and he denies waking up still tired. Patient is at risk for obstructive sleep apnea.  Patent has a history of symptoms of daytime fatigue. Patient generally gets 5 hours of sleep per night, and states they generally have restful sleep. Snoring is present. Apneic episodes are present. Epworth Sleepiness Score is 13  Dyspnea on exertion Steven Zimmerman notes increasing shortness of breath with certain activities and seems to be worsening over time with weight gain. He notes getting out of breath sooner with activity than he used to. This has not gotten worse recently. Steven Zimmerman denies orthopnea.  Vitamin D deficiency Steven Zimmerman has a diagnosis of vitamin D deficiency. He took high dose vitamin D. His last vitamin D level was at 27.89, and he denies nausea, vomiting or muscle weakness.  At risk for osteopenia and osteoporosis Steven Zimmerman is at higher risk of osteopenia and osteoporosis due to vitamin D deficiency.   GERD (gastroesophageal reflux disease) Steven Zimmerman has a diagnosis of gastroesophageal reflux disease and he is taking OTC medications. His GERD has improved.  B12 Deficiency Steven Zimmerman has a diagnosis of B12 insufficiency and notes fatigue. This is a new diagnosis. Steven Zimmerman is not a vegetarian and does not have a previous diagnosis of pernicious anemia. He does not have a history of weight loss surgery. Steven Zimmerman denies paresthesias.  Depression with emotional eating behaviors Steven Zimmerman is struggling with emotional eating and using food for comfort to the extent that it is negatively impacting his health. He often snacks when he is not hungry. Steven Zimmerman sometimes feels he is out of control and then feels guilty that he made poor food choices. Steven Zimmerman has nighttime eating behaviors. He  is attempting to work on behavior modification techniques to help reduce his emotional eating. He shows no sign of suicidal or homicidal ideations. PHQ-9 Score is 13  Depression Screen Steven Zimmerman's Food and Mood (modified PHQ-9) score was  Depression screen PHQ 2/9 06/06/2019  Decreased Interest 1  Down, Depressed,  Hopeless 1  PHQ - 2 Score 2  Altered sleeping 1  Tired, decreased energy 2  Change in appetite 1  Feeling bad or failure about yourself  0  Trouble concentrating 0  Moving slowly or fidgety/restless 0  Suicidal thoughts 0  PHQ-9 Score 6  Difficult doing work/chores Somewhat difficult    ASSESSMENT AND PLAN:  Other fatigue - Plan: EKG 12-Lead, Lipid Panel With LDL/HDL Ratio, Hemoglobin A1c, Insulin, random, Comprehensive metabolic panel  Shortness of breath on exertion  B12 nutritional deficiency - Plan: VITAMIN D 25 Hydroxy (Vit-D Deficiency, Fractures)  Vitamin D deficiency - Plan: VITAMIN D 25 Hydroxy (Vit-D Deficiency, Fractures)  Gastroesophageal reflux disease, esophagitis presence not specified  Other depression - with emotional eating  At risk for osteoporosis  Class 2 severe obesity with serious comorbidity and body mass index (BMI) of 39.0 to 39.9 in adult, unspecified obesity type (HCC)  PLAN:  Fatigue Steven Zimmerman was informed that his fatigue may be related to obesity, depression or many other causes. Labs will be ordered, and in the meanwhile Steven Zimmerman has agreed to work on diet, exercise and weight loss to help with fatigue. Proper sleep hygiene was discussed including the need for 7-8 hours of quality sleep each night. A sleep study was not ordered based on symptoms and Epworth score.  Dyspnea on exertion Steven Zimmerman's shortness of breath appears to be obesity related and exercise induced. He has agreed to work on weight loss. Steven Zimmerman will continue activity and slowly increase exercise to treat his exercise induced shortness of breath. If Steven Zimmerman follows our instructions and loses weight without improvement of his shortness of breath, we will plan to refer to pulmonology. We will monitor this condition regularly. Steven Zimmerman agrees to this plan.  Vitamin D Deficiency Steven Zimmerman was informed that low vitamin D levels contributes to fatigue and are associated with obesity, breast,  and colon cancer. [name] will continue to take OTC vitamin D until he returns to the office and he will follow up for routine testing of vitamin D, at least 2-3 times per year. He was informed of the risk of over-replacement of vitamin D and agrees to not increase his dose unless he discusses this with [place] first.  At risk for osteopenia and osteoporosis Steven Zimmerman was given extended (15 minutes) osteoporosis prevention counseling today. Steven Zimmerman is at risk for osteopenia and osteoporosis due to his vitamin D deficiency. He was encouraged to take his vitamin D and follow his higher calcium diet and increase strengthening exercise to help strengthen his bones and decrease his risk of osteopenia and osteoporosis.  GERD (gastroesophageal reflux disease) Steven Zimmerman will continue to take OTC medications if needed and he will watch for trigger foods.  B12 Deficiency Steven Zimmerman will work on increasing B12 rich foods in his diet. B12 supplementation was not prescribed today. We will check vitamin B12 level and Steven Zimmerman will follow up as directed.  Depression with Emotional Eating Behaviors We discussed behavior modification techniques today to help Gabriel deal with his emotional eating and depression. We will refer patient to Dr. Mallie Mussel our bariatric psychologist.  Depression Screen Creedence had a mildly positive depression screening. Depression is commonly associated with obesity and often results in  emotional eating behaviors. We will monitor this closely and work on CBT to help improve the non-hunger eating patterns.   Obesity Steven Zimmerman is currently in the action stage of change and his goal is to continue with weight loss efforts. I recommend Steven Zimmerman begin the structured treatment plan as follows:  He has agreed to follow the category 4 plan  Steven Zimmerman has been instructed to eventually work up to a goal of 150 minutes of combined cardio and strengthening exercise per week for weight loss and overall health  benefits. We discussed the following Behavioral Modification Strategies today: increase H2O intake, no skipping meals, keeping healthy foods in the home, increasing lean protein intake, decreasing simple carbohydrates, increasing vegetables, decrease eating out and work on meal planning and intentional eating   He was informed of the importance of frequent follow up visits to maximize his success with intensive lifestyle modifications for his multiple health conditions. He was informed we would discuss his lab results at his next visit unless there is a critical issue that needs to be addressed sooner. Steven Zimmerman agreed to keep his next visit at the agreed upon time to discuss these results.  ALLERGIES: No Known Allergies  MEDICATIONS: Current Outpatient Medications on File Prior to Visit  Medication Sig Dispense Refill  . ibuprofen (ADVIL,MOTRIN) 200 MG tablet Take 200 mg by mouth every 6 (six) hours as needed.    . ranitidine (ZANTAC) 150 MG tablet Take 150 mg by mouth daily as needed for heartburn.    . Vitamin D, Ergocalciferol, (DRISDOL) 1.25 MG (50000 UT) CAPS capsule TAKE 1 CAPSULE BY MOUTH EVERY 7 DAYS 16 capsule 1   No current facility-administered medications on file prior to visit.     PAST MEDICAL HISTORY: Past Medical History:  Diagnosis Date  . Anxiety   . Back pain   . Cancer ([place])    skin cancer  . Colon polyps 2006   hyperplastic polyps  . GERD (gastroesophageal reflux disease)   . History of hiatal hernia    umbilical and hiatal  . Joint pain   . Pneumonia    Viral as a child  . Vitamin D deficiency     PAST SURGICAL HISTORY: Past Surgical History:  Procedure Laterality Date  . HERNIA REPAIR  2008   x2 last in 2015  . INSERTION OF MESH N/A 01/15/2017   Procedure: INSERTION OF MESH;  Surgeon: Ralene Ok, MD;  Location: [facility];  Service: General;  Laterality: N/A;  . [place]  . VENTRAL HERNIA REPAIR N/A 01/15/2017   Procedure: LAPRASCOPIC ASSISTED  OPEN RECURRENT VENTRAL HERNIA REPAIR WITH MESH;  Surgeon: Ralene Ok, MD;  Location: [facility];  Service: General;  Laterality: N/A;    SOCIAL HISTORY: Social History   Tobacco Use  . Smoking status: Current Every Day Smoker    Packs/day: 1.00    Types: Cigarettes  . Smokeless tobacco: Never Used  Substance Use Topics  . Alcohol use: Yes    Comment: no daily but heavy (1 or 2 cases during the weekend)  . Drug use: No    FAMILY HISTORY: Family History  Problem Relation Age of Onset  . Colon cancer Mother   . Diabetes Mother   . High blood pressure Mother   . Stroke Mother   . Depression Mother   . Anxiety disorder Mother   . Obesity Mother   . Esophageal cancer Father   . Prostate cancer Neg Hx   . Coronary artery disease Neg  Hx     ROS: Review of Systems  Constitutional: Positive for malaise/fatigue.  HENT: Positive for tinnitus.        + Dentures  Eyes:       + Wear Glasses or Contacts  Respiratory: Positive for shortness of breath (on exertion).   Cardiovascular: Negative for orthopnea.  Gastrointestinal: Negative for nausea and vomiting.  Musculoskeletal:       Negative for muscle weakness  Neurological: Negative for tingling.  Psychiatric/Behavioral: Positive for depression.    PHYSICAL EXAM: Blood pressure 113/77, pulse 76, temperature 97.9 F (36.6 C), temperature source Oral, height 5\' 9"  (1.753 m), weight 265 lb (120.2 kg), SpO2 97 %. Body mass index is 39.13 kg/m. Physical Exam Vitals signs reviewed.  Constitutional:      Appearance: He is well-developed.  HENT:     Head: Normocephalic and atraumatic.     Nose: Nose normal.  Neck:     Musculoskeletal: Normal range of motion and neck supple.     Thyroid: No thyromegaly.  Cardiovascular:     Rate and Rhythm: Normal rate and regular rhythm.  Pulmonary:     Effort: Pulmonary effort is normal. No respiratory distress.  Abdominal:     Palpations: Abdomen is soft.     Tenderness: There is no  abdominal tenderness.     Hernia: A hernia (ventral) is present.  Musculoskeletal: Normal range of motion.     Comments: Range of Motion normal in all 4 extremities  Skin:    General: Skin is warm and dry.  Neurological:     Mental Status: He is alert and oriented to person, place, and time.  Psychiatric:        Behavior: Behavior normal.        Thought Content: Thought content does not include homicidal or suicidal ideation.     RECENT LABS AND TESTS: BMET    Component Value Date/Time   NA 139 09/16/2018 1603   K 4.2 09/16/2018 1603   CL 105 09/16/2018 1603   CO2 26 09/16/2018 1603   GLUCOSE 88 09/16/2018 1603   BUN 22 09/16/2018 1603   CREATININE 1.04 09/16/2018 1603   CREATININE 0.87 02/10/2018 1453   CALCIUM 9.2 09/16/2018 1603   No results found for: HGBA1C No results found for: INSULIN CBC    Component Value Date/Time   WBC 8.0 09/16/2018 1603   RBC 5.09 09/16/2018 1603   HGB 15.4 09/16/2018 1603   HCT 45.7 09/16/2018 1603   PLT 196.0 09/16/2018 1603   MCV 89.8 09/16/2018 1603   MCH 30.0 01/12/2017 0830   MCHC 33.7 09/16/2018 1603   RDW 13.8 09/16/2018 1603   LYMPHSABS 1.6 05/22/2011 1054   MONOABS 0.6 05/22/2011 1054   EOSABS 0.4 05/22/2011 1054   BASOSABS 0.0 05/22/2011 1054   Iron/TIBC/Ferritin/ %Sat No results found for: IRON, TIBC, FERRITIN, IRONPCTSAT Lipid Panel     Component Value Date/Time   CHOL 204 (H) 02/10/2018 1453   TRIG 145.0 02/10/2018 1453   HDL 48.50 02/10/2018 1453   CHOLHDL 4 02/10/2018 1453   VLDL 29.0 02/10/2018 1453   LDLCALC 126 (H) 02/10/2018 1453   LDLDIRECT 139.0 09/16/2018 1603   Hepatic Function Panel     Component Value Date/Time   PROT 6.9 09/16/2018 1603   ALBUMIN 4.2 09/16/2018 1603   AST 22 09/16/2018 1603   ALT 24 09/16/2018 1603   ALKPHOS 53 09/16/2018 1603   BILITOT 0.5 09/16/2018 1603      Component Value Date/Time  TSH 1.18 09/16/2018 1603   TSH 1.96 05/22/2011 1054   Results for NOEH, SPARACINO (MRN [id number]) as of 06/06/2019 14:28  Ref. Range 09/16/2018 16:03  VITD Latest Ref Range: 30.00 - 100.00 ng/mL 27.89 (L)    ECG  shows NSR with a rate of 73 BPM INDIRECT CALORIMETER done today shows a VO2 of 303 and a REE of 2110.  His calculated basal metabolic rate is 4098 thus his basal metabolic rate is worse than expected.    OBESITY BEHAVIORAL INTERVENTION VISIT  Today's visit was # 1   Starting weight: 265 lbs Starting date: 06/06/2019 Today's weight : 265 lbs  Today's date: 06/06/2019 Total lbs lost to date: 0    06/06/2019  Height 5\' 9"  (1.753 m)  Weight 265 lb (120.2 kg)  BMI (Calculated) 39.12  BLOOD PRESSURE - SYSTOLIC 119  BLOOD PRESSURE - DIASTOLIC 77  Waist Measurement  54 inches   Body Fat % 37.8 %  Total Body Water (lbs) 124.6 lbs  RMR 2110    ASK: We discussed the diagnosis of obesity with Steven Zimmerman today and Steven Zimmerman agreed to give [place] permission to discuss obesity behavioral modification therapy today.  ASSESS: Kodey has the diagnosis of obesity and his BMI today is 39.12 Davonn is in the action stage of change   ADVISE: Aquiles was educated on the multiple health risks of obesity as well as the benefit of weight loss to improve his health. He was advised of the need for long term treatment and the importance of lifestyle modifications to improve his current health and to decrease his risk of future health problems.  AGREE: Multiple dietary modification options and treatment options were discussed and  Jayd agreed to follow the recommendations documented in the above note.  ARRANGE: Esa was educated on the importance of frequent visits to treat obesity as outlined per CMS and USPSTF guidelines and agreed to schedule his next follow up appointment today.  Corey Skains, am acting as Location manager for [employer]. Owens Shark, DO   I have reviewed the above documentation for accuracy and completeness, and I agree with the above. -Jearld Lesch, DO

## 2019-06-07 ENCOUNTER — Encounter (INDEPENDENT_AMBULATORY_CARE_PROVIDER_SITE_OTHER): Payer: Self-pay | Admitting: Bariatrics

## 2019-06-07 DIAGNOSIS — R7303 Prediabetes: Secondary | ICD-10-CM | POA: Insufficient documentation

## 2019-06-07 LAB — VITAMIN B12: Vitamin B-12: 523 pg/mL (ref 232–1245)

## 2019-06-07 LAB — COMPREHENSIVE METABOLIC PANEL
ALT: 21 IU/L (ref 0–44)
AST: 18 IU/L (ref 0–40)
Albumin/Globulin Ratio: 1.6 (ref 1.2–2.2)
Albumin: 4 g/dL (ref 3.8–4.9)
Alkaline Phosphatase: 58 IU/L (ref 39–117)
BUN/Creatinine Ratio: 18 (ref 9–20)
BUN: 14 mg/dL (ref 6–24)
Bilirubin Total: 0.3 mg/dL (ref 0.0–1.2)
CO2: 22 mmol/L (ref 20–29)
Calcium: 9 mg/dL (ref 8.7–10.2)
Chloride: 101 mmol/L (ref 96–106)
Creatinine, Ser: 0.8 mg/dL (ref 0.76–1.27)
GFR calc Af Amer: 115 mL/min/{1.73_m2} (ref >59)
GFR calc non Af Amer: 99 mL/min/{1.73_m2} (ref >59)
Globulin, Total: 2.5 g/dL (ref 1.5–4.5)
Glucose: 82 mg/dL (ref 65–99)
Potassium: 4.3 mmol/L (ref 3.5–5.2)
Sodium: 139 mmol/L (ref 134–144)
Total Protein: 6.5 g/dL (ref 6.0–8.5)

## 2019-06-07 LAB — LIPID PANEL WITH LDL/HDL RATIO
Cholesterol, Total: 179 mg/dL (ref 100–199)
HDL: 52 mg/dL (ref >39)
LDL Calculated: 110 mg/dL — ABNORMAL HIGH (ref 0–99)
LDl/HDL Ratio: 2.1 ratio (ref 0.0–3.6)
Triglycerides: 84 mg/dL (ref 0–149)
VLDL Cholesterol Cal: 17 mg/dL (ref 5–40)

## 2019-06-07 LAB — VITAMIN D 25 HYDROXY (VIT D DEFICIENCY, FRACTURES): Vit D, 25-Hydroxy: 44.3 ng/mL (ref 30.0–100.0)

## 2019-06-07 LAB — INSULIN, RANDOM: INSULIN: 12.7 u[IU]/mL (ref 2.6–24.9)

## 2019-06-07 LAB — HEMOGLOBIN A1C
Est. average glucose Bld gHb Est-mCnc: 117 mg/dL
Hgb A1c MFr Bld: 5.7 % — ABNORMAL HIGH (ref 4.8–5.6)

## 2019-06-09 ENCOUNTER — Encounter (INDEPENDENT_AMBULATORY_CARE_PROVIDER_SITE_OTHER): Payer: Self-pay

## 2019-06-09 ENCOUNTER — Ambulatory Visit (INDEPENDENT_AMBULATORY_CARE_PROVIDER_SITE_OTHER): Payer: Managed Care, Other (non HMO) | Admitting: Psychology

## 2019-06-14 ENCOUNTER — Ambulatory Visit (INDEPENDENT_AMBULATORY_CARE_PROVIDER_SITE_OTHER): Payer: 59 | Admitting: Psychology

## 2019-06-14 ENCOUNTER — Other Ambulatory Visit: Payer: Self-pay

## 2019-06-14 DIAGNOSIS — F418 Other specified anxiety disorders: Secondary | ICD-10-CM

## 2019-06-14 NOTE — Progress Notes (Signed)
Office: [phone]  /  Fax: [phone]    Date: June 14, 2019    Appointment Start Time: 4:57pm Duration: 51 minutes Provider: Glennie Isle, Psy.D. Type of Session: Intake for Individual Therapy  Location of Patient: Home Location of Provider: Provider's Home Type of Contact: Telepsychological Visit via [employer] WebEx  Informed Consent: Prior to proceeding with today's appointment, two pieces of identifying information were obtained from Steven Zimmerman to verify identity. In addition, Steven Zimmerman's physical location at the time of this appointment was obtained. Steven Zimmerman reported he was at home and provided the address. In the event of technical difficulties, Steven Zimmerman shared a phone number he could be reached at. Steven Zimmerman and this provider participated in today's telepsychological service. Also, Steven Zimmerman denied anyone else being present in the room or on the WebEx appointment.   The provider's role was explained to Steven Zimmerman. The provider reviewed and discussed issues of confidentiality, privacy, and limits therein (e.g., reporting obligations). In addition to verbal informed consent, written informed consent for psychological services was obtained from Steven Zimmerman prior to the initial intake interview. Written consent included information concerning the practice, financial arrangements, and confidentiality and patients' rights. Since the clinic is not a 24/7 crisis center, mental health emergency resources were shared, and the provider explained MyChart, e-mail, voicemail, and/or other messaging systems should be utilized only for non-emergency reasons. This provider also explained that information obtained during appointments will be placed in [place] record in a confidential manner and relevant information will be shared with other providers at [facility] that he meets with for coordination of care. Steven Zimmerman verbally acknowledged understanding of the aforementioned, and agreed to use  mental health emergency resources discussed if needed. Moreover, Steven Zimmerman agreed information may be shared with other [facility] providers as needed for coordination of care. By signing the service agreement document, Steven Zimmerman provided written consent for coordination of care.   Prior to initiating telepsychological services, Steven Zimmerman was provided with an informed consent document, which included the development of a safety plan (i.e., an emergency contact and emergency resources) in the event of an emergency/crisis. Steven Zimmerman expressed understanding of the rationale of the safety plan and provided consent for this provider to reach out to his emergency contact in the event of an emergency/crisis. Steven Zimmerman returned the completed consent form prior to today's appointment. This provider verbally reviewed the consent form during today's appointment prior to proceeding with the appointment. Steven Zimmerman verbally acknowledged understanding that he is ultimately responsible for understanding his insurance benefits as it relates to reimbursement of telepsychological and in-person services. This provider also reviewed confidentiality, as it relates to telepsychological services, as well as the rationale for telepsychological services. More specifically, this provider's clinic is limiting in-person visits due to COVID-19. Therapeutic services will resume to in-person appointments once deemed appropriate. Steven Zimmerman expressed understanding regarding the rationale for telepsychological services. In addition, this provider explained the telepsychological services informed consent document would be considered an addendum to the initial consent document/service agreement. Steven Zimmerman verbally consented to proceed.   Chief Complaint/HPI: Steven Zimmerman was referred by Dr. Jearld Lesch due to depression with emotional eating behaviors. Per the note for the initial visit with Dr. Jearld Lesch on June 06, 2019, "Steven Zimmerman is struggling with  emotional eating and using food for comfort to the extent that it is negatively impacting his health. He often snacks when he is not hungry. Steven Zimmerman sometimes feels he is out of control and then feels guilty that he made poor food choices.  Steven Zimmerman has nighttime eating behaviors. He is attempting to work on behavior modification techniques to help reduce his emotional eating. He shows no sign of suicidal or homicidal ideations. PHQ-9 Score is 13." Steven Zimmerman also reported experiencing the following: snacking frequently in the evenings, frequently drinking liquids with calories, frequently eating larger portions than normal , binge eating behaviors, struggling with emotional eating and having problems with excessive hunger.    During today's appointment, Steven Zimmerman reported, "I do eat more when I think about things more." He was verbally administered a questionnaire assessing various behaviors related to emotional eating. Steven Zimmerman endorsed the following: overeat when you are celebrating, experience food cravings on a regular basis, use food to help you cope with emotional situations, find food is comforting to you, not worry about what you eat when you are in a good mood and eat as a reward. Steven Zimmerman noted the onset of emotional eating was likely in childhood. He added, "I've always been around people who cook really good." Prior to starting with the clinic, he reported uncertainty of the frequency of emotional eating. He shared he craves pasta, pizza, and carbohydrates. He denied a history of binge eating, but acknowledged eating larger portions of foods he enjoys. Steven Zimmerman denied a history of restricting food intake, purging and engagement in other compensatory strategies, and has never been diagnosed with an eating disorder. He also denied a history of treatment for emotional eating. Moreover, Steven Zimmerman indicated stress triggers emotional eating, whereas staying busy makes emotional eating better. Furthermore, Steven Zimmerman  stated he was told he needs to lose weight to have hernia surgery.   Mental Status Examination:  Appearance: neat Behavior: cooperative Mood: euthymic Affect: mood congruent Speech: normal in rate, volume, and tone Eye Contact: appropriate Psychomotor Activity: appropriate Thought Process: linear, logical, and goal directed  Content/Perceptual Disturbances: denies suicidal and homicidal ideation, plan, and intent and no hallucinations, delusions, bizarre thinking or behavior reported or observed Orientation: time, person, place and purpose of appointment Cognition/Sensorium: memory, attention, language, and fund of knowledge intact  Insight: fair Judgment: fair  Family & Psychosocial History: Steven Zimmerman reported he is married and he has three adult children, as well as six grandchildren. 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 Currently, Steven Zimmerman's social support system consists of his wife, co-workers, friends and children. Moreover, Devere stated he resides with his wife, son, son's wife, and three grandchildren.   Medical History:  Past Medical History:  Diagnosis Date   Anxiety    Back pain    Cancer ([place])    skin cancer   Colon polyps 2006   hyperplastic polyps   GERD (gastroesophageal reflux disease)    History of hiatal hernia    umbilical and hiatal   Joint pain    Pneumonia    Viral as a child   Vitamin D deficiency    Past Surgical History:  Procedure Laterality Date   HERNIA REPAIR  2008   x2 last in 2015   [place] N/A 01/15/2017   Procedure: INSERTION OF MESH;  Surgeon: Ralene Ok, MD;  Location: [facility];  Service: General;  Laterality: N/A;   [place] N/A 01/15/2017   Procedure: LAPRASCOPIC ASSISTED OPEN RECURRENT VENTRAL HERNIA REPAIR WITH  MESH;  Surgeon: Ralene Ok, MD;  Location: [facility];  Service: General;  Laterality: N/A;  Current Outpatient Medications on File Prior to Visit  Medication Sig Dispense Refill   ibuprofen (ADVIL,MOTRIN) 200 MG tablet Take 200 mg by mouth every 6 (six) hours as needed.     ranitidine (ZANTAC) 150 MG tablet Take 150 mg by mouth daily as needed for heartburn.     Vitamin D, Ergocalciferol, (DRISDOL) 1.25 MG (50000 UT) CAPS capsule TAKE 1 CAPSULE BY MOUTH EVERY 7 DAYS 16 capsule 1   No current facility-administered medications on file prior to visit.   Kashus denied a history of head injuries and loss of consciousness.   Mental Health History: Stone denied a history of therapeutic services. Kamar denied a history of hospitalizations for psychiatric concerns, and has never met with a psychiatrist. Ruhan denied current prescriptions for psychotropic medications. In 2012, Nethaniel was prescribed Citalopram. He believes he was prescribed another medication as well for anxiety, but was unsure of the name. Cardale endorsed a family history of mental health related concerns. More specifically, his paternal aunt and paternal grandmother "had mental health issues." He was unsure of their diagnoses. Zaccheaus denied a trauma history, including psychological, physical  and sexual abuse, as well as neglect.   Verlie described his typical mood as "pretty good." Aside from concerns noted above and endorsed on the PHQ-9 and GAD-7, Cove reported experiencing worry thoughts about current events and feeling irritable when "people do no react in the way [he] think[s] they should react." Ismail endorsed current alcohol use. He added, "I've cut back on my drinking a lot. When I do drink, I probably drink too much at home." Ithiel stated he consumes alcohol "maybe once a week" at home. He described the quantity as "12 to 17" standard 12 oz beers. Kristain explained the beers are consumed over the course of 10  hours. He added, "I don't drink while I'm working like in the yard and stuff." Johnrobert denied blacking out and noted, "I'm pretty wasted at that point or usually wore out." He explained he typically just goes to sleep. He denied any consequences of his alcohol use, including legal concerns. He endorsed "occasional" tobacco use. He explained he smokes 1-2 cigarettes "here and there." He denied illicit/recreational substance use. Regarding caffeine intake, Joe reported drinking coffee occasionally and diet [facility] or soda. He shared he has reduced soda intake recently. Furthermore, Steven Zimmerman denied experiencing the following: hopelessness, hallucinations and delusions, paranoia, mania and decreased motivation. He also denied history of and current suicidal ideation, plan, and intent; history of and current homicidal ideation, plan, and intent; and history of and current engagement in self-harm. He noted, "I'm anti-suicidal."   The following strengths were reported by Steven Zimmerman: mechanical abilities and pretty good at fixing things. The following strengths were observed by this provider: ability to express thoughts and feelings during the therapeutic session, ability to establish and benefit from a therapeutic relationship, ability to learn and practice coping skills, willingness to work toward established goal(s) with the clinic and ability to engage in reciprocal conversation.  Legal History: Miraj denied a history of legal involvement.   Structured Assessment Results: The Patient Health Questionnaire-9 (PHQ-9) is a self-report measure that assesses symptoms and severity of depression over the course of the last two weeks. Llewyn obtained a score of 4 suggesting minimal depression. Rose finds the endorsed symptoms to be not difficult at all. Little interest or pleasure in doing things 0  Feeling down, depressed, or hopeless 0  Trouble falling or staying asleep, or sleeping too much 0  Feeling  tired or having little energy 2  Poor appetite or overeating 2  Feeling bad about yourself --- or that you are a failure or have let yourself or your family down 0  Trouble concentrating on things, such as reading the newspaper or watching television 0  Moving or speaking so slowly that other people could have noticed? Or the opposite --- being so fidgety or restless that you have been moving around a lot more than usual 0  Thoughts that you would be better off dead or hurting yourself in some way 0  PHQ-9 Score 4    The Generalized Anxiety Disorder-7 (GAD-7) is a brief self-report measure that assesses symptoms of anxiety over the course of the last two weeks. Kinston obtained a score of 2 suggesting minimal anxiety. Calem finds the endorsed symptoms to be not difficult at all. Feeling nervous, anxious, on edge 1  Not being able to stop or control worrying 0  Worrying too much about different things 0  Trouble relaxing 0  Being so restless that it's hard to sit still 0  Becoming easily annoyed or irritable 1  Feeling afraid as if something awful might happen 0  GAD-7 Score 2   Interventions: A chart review was conducted prior to the clinical intake interview. The PHQ-9, and GAD-7 were verbally  administered as well as a Mood and Food questionnaire to assess various behaviors related to emotional eating. Throughout session, empathic reflections and validation was provided. Continuing treatment with this provider was discussed and a treatment goal was established. Psychoeducation regarding emotional versus physical hunger was provided. Cordale was sent a handout via e-mail to utilize between now and the next appointment to increase awareness of hunger patterns and subsequent eating. Steven Zimmerman provided verbal consent during today's appointment for this provider to send the handout via e-mail.   Provisional DSM-5 Diagnosis: 300.09 (F41.8) Other Specified Anxiety Disorder, Emotional Eating  Behaviors  Plan: Crimson appears able and willing to participate as evidenced by collaboration on a treatment goal, engagement in reciprocal conversation, and asking questions as needed for clarification. The next appointment will be scheduled in two weeks, which will be via [employer]. The following treatment goal was established: decrease emotional eating. Once this provider's office resumes in-person appointments and it is deemed appropriate, Jamire will be notified. For the aforementioned goal, Jozef can benefit from biweekly individual therapy sessions that are brief in duration for approximately four to six sessions. The treatment modality will be individual therapeutic services, including an eclectic therapeutic approach utilizing techniques from Cognitive Behavioral Therapy, Patient Centered Therapy, Dialectical Behavior Therapy, Acceptance and Commitment Therapy, Interpersonal Therapy, and Cognitive Restructuring. Therapeutic approach will include various interventions as appropriate, such as validation, support, mindfulness, thought defusion, reframing, psychoeducation, values assessment, and role playing. This provider will regularly review the treatment plan and medical chart to keep informed of status changes. Ory expressed understanding and agreement with the initial treatment plan of care.

## 2019-06-20 ENCOUNTER — Ambulatory Visit (INDEPENDENT_AMBULATORY_CARE_PROVIDER_SITE_OTHER): Payer: 59 | Admitting: Bariatrics

## 2019-06-20 ENCOUNTER — Other Ambulatory Visit: Payer: Self-pay

## 2019-06-20 ENCOUNTER — Encounter (INDEPENDENT_AMBULATORY_CARE_PROVIDER_SITE_OTHER): Payer: Self-pay | Admitting: Bariatrics

## 2019-06-20 VITALS — BP 103/67 | HR 70 | Temp 98.2°F | Ht 69.0 in | Wt 252.0 lb

## 2019-06-20 DIAGNOSIS — E6609 Other obesity due to excess calories: Secondary | ICD-10-CM

## 2019-06-20 DIAGNOSIS — Z6837 Body mass index (BMI) 37.0-37.9, adult: Secondary | ICD-10-CM

## 2019-06-20 DIAGNOSIS — K219 Gastro-esophageal reflux disease without esophagitis: Secondary | ICD-10-CM | POA: Diagnosis not present

## 2019-06-20 DIAGNOSIS — R7303 Prediabetes: Secondary | ICD-10-CM | POA: Diagnosis not present

## 2019-06-20 DIAGNOSIS — Z9189 Other specified personal risk factors, not elsewhere classified: Secondary | ICD-10-CM

## 2019-06-21 NOTE — Progress Notes (Signed)
Office: [phone]  /  Fax: [phone]   HPI:   Chief Complaint: OBESITY Steven Zimmerman is here to discuss his progress with his obesity treatment plan. He is on the Category 4 plan and is following his eating plan approximately 100 % of the time. He states he is exercising 0 minutes 0 times per week. Steven Zimmerman is down 13 pounds. He states that he did not have any major problems with the diet. He is drinking adequate water. His weight is 252 lb (114.3 kg) today and has had a weight loss of 13 pounds over a period of 2 weeks since his last visit. He has lost 13 lbs since starting treatment with [place].  Pre-Diabetes Steven Zimmerman has a diagnosis of prediabetes based on his elevated Hgb A1c and was informed this puts him at greater risk of developing diabetes. His last A1c was at 5.7 and last insulin level was at 12.7 He is not taking medications currently and continues to work on diet and exercise to decrease risk of diabetes. He denies nausea or hypoglycemia.  At risk for diabetes Steven Zimmerman is at higher than average risk for developing diabetes due to his obesity and prediabetes. He currently denies polyuria or polydipsia.  GERD (gastroesophageal reflux disease) Steven Zimmerman has a diagnosis of GERD and he has no increase in heartburn. He is not taking medications.  ASSESSMENT AND PLAN:  Prediabetes  Gastroesophageal reflux disease, esophagitis presence not specified  At risk for diabetes mellitus  Class 2 obesity due to excess calories with body mass index (BMI) of 37.0 to 37.9 in adult, unspecified whether serious comorbidity present  PLAN:  Pre-Diabetes Steven Zimmerman will continue to work on weight loss, increasing activity, increasing lean protein and decreasing simple carbohydrates in his diet to help decrease the risk of diabetes. He was informed that eating too many simple carbohydrates or too many calories at one sitting increases the likelihood of GI side effects. Steven Zimmerman agreed to follow up with [place]  as directed to monitor his progress.  Diabetes risk counseling Steven Zimmerman was given extended (15 minutes) diabetes prevention counseling today. He is 58 y.o. male and has risk factors for diabetes including obesity and prediabetes. We discussed intensive lifestyle modifications today with an emphasis on weight loss as well as increasing exercise and decreasing simple carbohydrates in his diet.  GERD (gastroesophageal reflux disease) Steven Zimmerman will decrease trigger foods and weight loss should help. He will take Tums or Rolaids as recommended and follow up as directed.  Obesity Steven Zimmerman is currently in the action stage of change. As such, his goal is to continue with weight loss efforts He has agreed to follow the Category 4 plan Steven Zimmerman has been instructed to work up to a goal of 150 minutes of combined cardio and strengthening exercise per week for weight loss and overall health benefits. We discussed the following Behavioral Modification Strategies today: planning for success, increase H2O intake, no skipping meals, keeping healthy foods in the home, increasing lean protein intake, decreasing simple carbohydrates, increasing vegetables, decrease eating out and work on meal planning and intentional eating  Steven Zimmerman has agreed to follow up with our clinic in 2 weeks. He was informed of the importance of frequent follow up visits to maximize his success with intensive lifestyle modifications for his multiple health conditions.  ALLERGIES: No Known Allergies  MEDICATIONS: Current Outpatient Medications on File Prior to Visit  Medication Sig Dispense Refill   ibuprofen (ADVIL,MOTRIN) 200 MG tablet Take 200 mg by mouth every 6 (six) hours as  needed.     ranitidine (ZANTAC) 150 MG tablet Take 150 mg by mouth daily as needed for heartburn.     Vitamin D, Ergocalciferol, (DRISDOL) 1.25 MG (50000 UT) CAPS capsule TAKE 1 CAPSULE BY MOUTH EVERY 7 DAYS 16 capsule 1   No current facility-administered  medications on file prior to visit.     PAST MEDICAL HISTORY: Past Medical History:  Diagnosis Date   Anxiety    Back pain    Cancer ([place])    skin cancer   Colon polyps 2006   hyperplastic polyps   GERD (gastroesophageal reflux disease)    History of hiatal hernia    umbilical and hiatal   Joint pain    Pneumonia    Viral as a child   Vitamin D deficiency     PAST SURGICAL HISTORY: Past Surgical History:  Procedure Laterality Date   HERNIA REPAIR  2008   x2 last in 2015   [place] N/A 01/15/2017   Procedure: INSERTION OF MESH;  Surgeon: Ralene Ok, MD;  Location: [facility];  Service: General;  Laterality: N/A;   [place] N/A 01/15/2017   Procedure: LAPRASCOPIC ASSISTED OPEN RECURRENT VENTRAL HERNIA REPAIR WITH MESH;  Surgeon: Ralene Ok, MD;  Location: [facility];  Service: General;  Laterality: N/A;    SOCIAL HISTORY: Social History   Tobacco Use   Smoking status: Current Every Day Smoker    Packs/day: 1.00    Types: Cigarettes   Smokeless tobacco: Never Used  Substance Use Topics   Alcohol use: Yes    Comment: no daily but heavy (1 or 2 cases during the weekend)   Drug use: No    FAMILY HISTORY: Family History  Problem Relation Age of Onset   Colon cancer Mother    Diabetes Mother    High blood pressure Mother    Stroke Mother    Depression Mother    Anxiety disorder Mother    Obesity Mother    Esophageal cancer Father    Prostate cancer Neg Hx    Coronary artery disease Neg Hx     ROS: Review of Systems  Constitutional: Positive for weight loss.  Gastrointestinal: Negative for nausea.  Genitourinary: Negative for frequency.  Endo/Heme/Allergies: Negative for polydipsia.       Negative for hypoglycemia    PHYSICAL EXAM: Blood pressure 103/67, pulse 70, temperature 98.2 F (36.8 C), temperature source Oral, height 5\' 9"  (1.753 m), weight 252 lb (114.3 kg), SpO2 (!) 6 %. Body  mass index is 37.21 kg/m. Physical Exam Vitals signs reviewed.  Constitutional:      Appearance: Normal appearance. He is well-developed. He is obese.  Cardiovascular:     Rate and Rhythm: Normal rate.  Pulmonary:     Effort: Pulmonary effort is normal.  Musculoskeletal: Normal range of motion.  Skin:    General: Skin is warm and dry.  Neurological:     Mental Status: He is alert and oriented to person, place, and time.  Psychiatric:        Mood and Affect: Mood normal.        Behavior: Behavior normal.     RECENT LABS AND TESTS: BMET    Component Value Date/Time   NA 139 06/06/2019 1010   K 4.3 06/06/2019 1010   CL 101 06/06/2019 1010   CO2 22 06/06/2019 1010   GLUCOSE 82 06/06/2019 1010   GLUCOSE 88 09/16/2018 1603   BUN 14 06/06/2019  1010   CREATININE 0.80 06/06/2019 1010   CREATININE 0.87 02/10/2018 1453   CALCIUM 9.0 06/06/2019 1010   GFRNONAA 99 06/06/2019 1010   GFRAA 115 06/06/2019 1010   Lab Results  Component Value Date   HGBA1C 5.7 (H) 06/06/2019   Lab Results  Component Value Date   INSULIN 12.7 06/06/2019   CBC    Component Value Date/Time   WBC 8.0 09/16/2018 1603   RBC 5.09 09/16/2018 1603   HGB 15.4 09/16/2018 1603   HCT 45.7 09/16/2018 1603   PLT 196.0 09/16/2018 1603   MCV 89.8 09/16/2018 1603   MCH 30.0 01/12/2017 0830   MCHC 33.7 09/16/2018 1603   RDW 13.8 09/16/2018 1603   LYMPHSABS 1.6 05/22/2011 1054   MONOABS 0.6 05/22/2011 1054   EOSABS 0.4 05/22/2011 1054   BASOSABS 0.0 05/22/2011 1054   Iron/TIBC/Ferritin/ %Sat No results found for: IRON, TIBC, FERRITIN, IRONPCTSAT Lipid Panel     Component Value Date/Time   CHOL 179 06/06/2019 1010   TRIG 84 06/06/2019 1010   HDL 52 06/06/2019 1010   CHOLHDL 4 02/10/2018 1453   VLDL 29.0 02/10/2018 1453   LDLCALC 110 (H) 06/06/2019 1010   LDLDIRECT 139.0 09/16/2018 1603   Hepatic Function Panel     Component Value Date/Time   PROT 6.5 06/06/2019 1010   ALBUMIN 4.0 06/06/2019  1010   AST 18 06/06/2019 1010   ALT 21 06/06/2019 1010   ALKPHOS 58 06/06/2019 1010   BILITOT 0.3 06/06/2019 1010      Component Value Date/Time   TSH 1.18 09/16/2018 1603   TSH 1.96 05/22/2011 1054     Ref. Range 06/06/2019 10:10  Vitamin D, 25-Hydroxy Latest Ref Range: 30.0 - 100.0 ng/mL 44.3    OBESITY BEHAVIORAL INTERVENTION VISIT  Today's visit was # 2   Starting weight: 265 lbs Starting date: 06/06/2019 Today's weight : 252 lbs Today's date: 06/20/2019 Total lbs lost to date: 13    06/20/2019  Height 5\' 9"  (1.753 m)  Weight 252 lb (114.3 kg)  BMI (Calculated) 37.2  BLOOD PRESSURE - SYSTOLIC 329  BLOOD PRESSURE - DIASTOLIC 67   Body Fat % 51.8 %  Total Body Water (lbs) 121 lbs     ASK: We discussed the diagnosis of obesity with Steven Zimmerman today and Steven Zimmerman agreed to give [place] permission to discuss obesity behavioral modification therapy today.  ASSESS: Steven Zimmerman has the diagnosis of obesity and his BMI today is 37.2 Steven Zimmerman is in the action stage of change   ADVISE: Steven Zimmerman was educated on the multiple health risks of obesity as well as the benefit of weight loss to improve his health. He was advised of the need for long term treatment and the importance of lifestyle modifications to improve his current health and to decrease his risk of future health problems.  AGREE: Multiple dietary modification options and treatment options were discussed and  Steven Zimmerman agreed to follow the recommendations documented in the above note.  ARRANGE: Steven Zimmerman was educated on the importance of frequent visits to treat obesity as outlined per CMS and USPSTF guidelines and agreed to schedule his next follow up appointment today.  Corey Skains, am acting as Location manager for [employer]. Owens Shark, DO  I have reviewed the above documentation for accuracy and completeness, and I agree with the above. -Jearld Lesch, DO

## 2019-06-29 ENCOUNTER — Other Ambulatory Visit: Payer: Self-pay

## 2019-06-29 ENCOUNTER — Ambulatory Visit (INDEPENDENT_AMBULATORY_CARE_PROVIDER_SITE_OTHER): Payer: 59 | Admitting: Psychology

## 2019-06-29 ENCOUNTER — Telehealth (INDEPENDENT_AMBULATORY_CARE_PROVIDER_SITE_OTHER): Payer: Self-pay | Admitting: Psychology

## 2019-06-29 NOTE — Progress Notes (Unsigned)
Office: [phone]  /  Fax: [phone]    Date: June 29, 2019    Appointment Start Time:*** Duration:*** Provider: Glennie Isle, Psy.D. Type of Session: Individual Therapy  Location of Patient: *** Location of Provider: [facility] Type of Contact: Telepsychological Visit via [employer] WebEx   Session Content: Steven Zimmerman is a 58 y.o. male presenting via [place] for a follow-up appointment to address the previously established treatment goal of decreasing emotional eating. Today's appointment was a telepsychological visit, as this provider's clinic is seeing a limited number of patients for in-person visits due to COVID-19. Therapeutic services will resume to in-person appointments once deemed appropriate. Steven Zimmerman expressed understanding regarding the rationale for telepsychological services, and provided verbal consent for today's appointment. Prior to proceeding with today's appointment, Steven Zimmerman's physical location at the time of this appointment was obtained. Steven Zimmerman reported he was at *** and provided the address. In the event of technical difficulties, Steven Zimmerman shared a phone number he could be reached at. Steven Zimmerman and this provider participated in today's telepsychological service. Also, Steven Zimmerman denied anyone else being present in the room or on the WebEx appointment ***.  This provider conducted a brief check-in and verbally administered the PHQ-9 and GAD-7. ***   Steven Zimmerman was receptive to today's session as evidenced by openness to sharing, responsiveness to feedback, and ***.  Mental Status Examination:  Appearance: {Appearance:22431} Behavior: {Behavior:22445} Mood: {Teletherapy mood:22435} Affect: {Affect:22436} Speech: {Speech:22432} Eye Contact: {Eye Contact:22433} Psychomotor Activity: {Motor Activity:22434} Thought Process: {thought process:22448}  Content/Perceptual Disturbances: {disturbances:22451} Orientation: {Orientation:22437}  Cognition/Sensorium: {gbcognition:22449} Insight: {Insight:22446} Judgment: {Insight:22446}  Structured Assessment Results: The Patient Health Questionnaire-9 (PHQ-9) is a self-report measure that assesses symptoms and severity of depression over the course of the last two weeks. Steven Zimmerman obtained a score of *** suggesting {GBPHQ9SEVERITY:21752}. Steven Zimmerman finds the endorsed symptoms to be {gbphq9difficulty:21754}. Steven Zimmerman interest or pleasure in doing things ***  Feeling down, depressed, or hopeless ***  Trouble falling or staying asleep, or sleeping too much ***  Feeling tired or having Steven Zimmerman energy ***  Poor appetite or overeating ***  Feeling bad about yourself --- or that you are a failure or have let yourself or your family down ***  Trouble concentrating on things, such as reading the newspaper or watching television ***  Moving or speaking so slowly that other people could have noticed? Or the opposite --- being so fidgety or restless that you have been moving around a lot more than usual ***  Thoughts that you would be better off dead or hurting yourself in some way ***  PHQ-9 Score ***    The Generalized Anxiety Disorder-7 (GAD-7) is a brief self-report measure that assesses symptoms of anxiety over the course of the last two weeks. Steven Zimmerman obtained a score of *** suggesting {gbgad7severity:21753}. Steven Zimmerman finds the endorsed symptoms to be {gbphq9difficulty:21754}. Feeling nervous, anxious, on edge ***  Not being able to stop or control worrying ***  Worrying too much about different things ***  Trouble relaxing ***  Being so restless that it's hard to sit still ***  Becoming easily annoyed or irritable ***  Feeling afraid as if something awful might happen ***  GAD-7 Score ***   Interventions:  {Interventions:22172}  DSM-5 Diagnosis: 311 (F32.8) Other Specified Depressive Disorder, Emotional Eating Behaviors  Treatment Goal & Progress: During the initial appointment with this  provider, the following treatment goal was established: decrease emotional eating. Progress is limited, as Steven Zimmerman has just begun treatment with this provider; however, he is  receptive to the interaction and interventions and rapport is being established.   Plan: Steven Zimmerman continues to appear able and willing to participate as evidenced by engagement in reciprocal conversation, and asking questions for clarification as appropriate. The next appointment will be scheduled in {gbweeks:21758}, which will be via [employer]. Once this provider's office resumes in-person appointments and it is deemed appropriate, Steven Zimmerman will be notified. The next session will focus on reviewing learned skills, and working towards the established treatment goal.***

## 2019-06-29 NOTE — Telephone Encounter (Signed)
  Office: [phone]  /  Fax: [phone]  Date of Call: June 29, 2019  Time of Call:  2:35pm Duration of Call: 3 minutes Provider: Glennie Isle, PsyD  CONTENT: This provider called [name] to check-in as he did not present for today's Webex appointment at 2:30pm. He indicated he was out as he thought his appointment was at 4pm.   PLAN: Traylen was rescheduled for an appointment for June 30, 2019 at 2:00pm.

## 2019-06-29 NOTE — Progress Notes (Unsigned)
Office: [phone]  /  Fax: [phone]    Date: June 30, 2019   Appointment Start Time:*** Duration:*** Provider: Glennie Isle, Psy.D. Type of Session: Individual Therapy  Location of Patient: *** [facility] Provider: [facility] Type of Contact: Telepsychological Visit via [employer] WebEx   Session Content: Steven Zimmerman is a 58 y.o. male presenting via [place] for a follow-up appointment to address the previously established treatment goal of decreasing emotional eating. Today's appointment was a telepsychological visit, as this provider's clinic is seeing a limited number of patients for in-person visits due to COVID-19. Therapeutic services will resume to in-person appointments once deemed appropriate. Steven Zimmerman expressed understanding regarding the rationale for telepsychological services, and provided verbal consent for today's appointment. Prior to proceeding with today's appointment, Steven Zimmerman's physical location at the time of this appointment was obtained. [name] reported he was at *** and provided the address. In the event of technical difficulties, Steven Zimmerman shared a phone number he could be reached at. Steven Zimmerman and this provider participated in today's telepsychological service. Also, Steven Zimmerman denied anyone else being present in the room or on the WebEx appointment ***.  This provider conducted a brief check-in and verbally administered the PHQ-9 and GAD-7. *** This provider assess Steven Zimmerman's alcohol use.   Steven Zimmerman was receptive to today's session as evidenced by openness to sharing, responsiveness to feedback, and ***.  Mental Status Examination:  Appearance: {Appearance:22431} Behavior: {Behavior:22445} Mood: {Teletherapy mood:22435} Affect: {Affect:22436} Speech: {Speech:22432} Eye Contact: {Eye Contact:22433} Psychomotor Activity: {Motor Activity:22434} Thought Process: {thought process:22448}  Content/Perceptual Disturbances: {disturbances:22451}  Orientation: {Orientation:22437} Cognition/Sensorium: {gbcognition:22449} Insight: {Insight:22446} Judgment: {Insight:22446}  Structured Assessment Results: The Patient Health Questionnaire-9 (PHQ-9) is a self-report measure that assesses symptoms and severity of depression over the course of the last two weeks. [name] obtained a score of *** suggesting {GBPHQ9SEVERITY:21752}. [name] finds the endorsed symptoms to be {gbphq9difficulty:21754}. Steven Zimmerman in doing things ***  Feeling down, depressed, or hopeless ***  Trouble falling or staying asleep, or sleeping too much ***  Feeling tired or having Steven energy ***  Poor appetite or overeating ***  Feeling bad about yourself --- or that you are a failure or have let yourself or your family down ***  Trouble concentrating on things, such as reading the newspaper or watching television ***  Moving or speaking so slowly that other people could have noticed? Or the opposite --- being so fidgety or restless that you have been moving around a lot more than usual ***  Thoughts that you would be better off dead or hurting yourself in some way ***  PHQ-9 Score ***    The Generalized Anxiety Disorder-7 (GAD-7) is a brief self-report measure that assesses symptoms of anxiety over the course of the last two weeks. [name] obtained a score of *** suggesting {gbgad7severity:21753}. [name] finds the endorsed symptoms to be {gbphq9difficulty:21754}. Feeling nervous, anxious, on edge ***  Not being able to stop or control worrying ***  Worrying too much about different things ***  Trouble relaxing ***  Being so restless that it's hard to sit still ***  Becoming easily annoyed or irritable ***  Feeling afraid as if something awful might happen ***  GAD-7 Score ***   Interventions:  {Interventions:22172}  DSM-5 Diagnosis: 300.09 (F41.8) Other Specified Anxiety Disorder, Emotional Eating Behaviors  Treatment Goal & Progress: During  the initial appointment with this provider, the following treatment goal was established: decrease emotional eating. Progress is limited, as [name] has just begun treatment with  this provider; however, he is receptive to the interaction and interventions and rapport is being established.   Plan: [name] continues to appear able and willing to participate as evidenced by engagement in reciprocal conversation, and asking questions for clarification as appropriate. The next appointment will be scheduled in {gbweeks:21758}, which will be via [employer]. Once this provider's office resumes in-person appointments and it is deemed appropriate, [name] will be notified. The next session will focus on reviewing learned skills, and working towards the established treatment goal.***

## 2019-06-30 ENCOUNTER — Ambulatory Visit (INDEPENDENT_AMBULATORY_CARE_PROVIDER_SITE_OTHER): Payer: 59 | Admitting: Psychology

## 2019-06-30 ENCOUNTER — Telehealth (INDEPENDENT_AMBULATORY_CARE_PROVIDER_SITE_OTHER): Payer: Self-pay | Admitting: Psychology

## 2019-06-30 ENCOUNTER — Encounter (INDEPENDENT_AMBULATORY_CARE_PROVIDER_SITE_OTHER): Payer: Self-pay

## 2019-06-30 NOTE — Telephone Encounter (Signed)
  Office: [phone]  /  Fax: [phone]  Date of Call: June 30, 2019  Time of Call: 2:02pm Provider: Glennie Isle, PsyD  CONTENT: This provider called Steven Zimmerman to check-in as he did not present for today's Webex appointment at 2:00pm. A HIPAA compliant voicemail was left requesting a call back. Of note, this provider stayed on the [facility] appointment for 7 minutes prior to signing off, which is 2 additional minutes past the clinic's 5 minute grace period policy.    PLAN: This provider will wait for [name] to call back. If deemed necessary, this provider or the provider's clinic will call [name] again in approximately one week.

## 2019-07-06 ENCOUNTER — Ambulatory Visit (INDEPENDENT_AMBULATORY_CARE_PROVIDER_SITE_OTHER): Payer: 59 | Admitting: Bariatrics

## 2019-07-20 NOTE — Progress Notes (Signed)
Office: [phone]  /  Fax: [phone]    Date: July 26, 2019   Appointment Start Time: 3:58pm Duration: 37 minutes Provider: Glennie Isle, Psy.D. Type of Session: Individual Therapy  Location of Patient: Home Location of Provider: [facility] Type of Contact: Telepsychological Visit via [employer] Web[employer]   Session Content: Steven Zimmerman is a 58 y.o. male presenting via [place] for a follow-up appointment to address the previously established treatment goal of decreasing emotional eating. Today's appointment was a telepsychological visit, as this provider's clinic is seeing a limited number of patients for in-person visits due to COVID-19. Therapeutic services will resume to in-person appointments once deemed appropriate. Steven Zimmerman expressed understanding regarding the rationale for telepsychological services, and provided verbal consent for today's appointment. Prior to proceeding with today's appointment, Steven Zimmerman's physical location at the time of this appointment was obtained. Steven Zimmerman reported he was at home and provided the address. In the event of technical difficulties, Steven Zimmerman shared a phone number he could be reached at. Steven Zimmerman and this provider participated in today's telepsychological service. Also, Steven Zimmerman denied anyone else being present in the room or on the WebEx appointment.  This provider conducted a brief check-in and verbally administered the PHQ-9 and GAD-7. Steven Zimmerman shared he missed prior appointments due to going on vacation. Regarding eating, "I've been slacking a little bit. You know how vacations are." He acknowledged he returned from vacation approximately two weeks ago. It was recommended he call the office to schedule an appointment with Dr. Owens Shark. Of note, he discussed experiencing frustration with scheduling for his appointment with Dr. Owens Shark. Associated thoughts and feelings were processed. He indicated he would call the clinic. Regarding alcohol  use, Steven Zimmerman stated, "I think I'm doing better at it." He discussed an increase in consumption while on vacation and clarified, "I wasn't drinking all day or anything." Outside of vacation, he noted his alcohol use has decreased to less than half of what he reported consuming before. To assist with eating habits, psychoeducation regarding triggers for emotional eating was provided. Steven Zimmerman was provided a handout, and encouraged to utilize the handout between now and the next appointment to increase awareness of triggers and frequency. Steven Zimmerman agreed. This provider also discussed behavioral strategies for specific triggers, such as placing the utensil down when conversing to avoid mindless eating. Steven Zimmerman provided verbal consent during today's appointment for this provider to send the handout about triggers via e-mail. Steven Zimmerman was receptive to today's session as evidenced by openness to sharing, responsiveness to feedback, and willingness to explore triggers for emotional eating.  Mental Status Examination:  Appearance: neat Behavior: cooperative Mood: euthymic Affect: mood congruent Speech: normal in rate, volume, and tone Eye Contact: appropriate Psychomotor Activity: appropriate Thought Process: linear, logical, and goal directed  Content/Perceptual Disturbances: denies suicidal and homicidal ideation, plan, and intent and no hallucinations, delusions, bizarre thinking or behavior reported or observed Orientation: time, person, place and purpose of appointment Cognition/Sensorium: memory, attention, language, and fund of knowledge intact  Insight: fair Judgment: fair  Structured Assessment Results: The Patient Health Questionnaire-9 (PHQ-9) is a self-report measure that assesses symptoms and severity of depression over the course of the last two weeks. Steven Zimmerman obtained a score of 0. Little interest or pleasure in doing things 0  Feeling down, depressed, or hopeless 0  Trouble falling or staying  asleep, or sleeping too much 0  Feeling tired or having little energy 0  Poor appetite or overeating 0  Feeling bad about yourself --- or that  you are a failure or have let yourself or your family down 0  Trouble concentrating on things, such as reading the newspaper or watching television 0  Moving or speaking so slowly that other people could have noticed? Or the opposite --- being so fidgety or restless that you have been moving around a lot more than usual 0  Thoughts that you would be better off dead or hurting yourself in some way 0  PHQ-9 Score 0    The Generalized Anxiety Disorder-7 (GAD-7) is a brief self-report measure that assesses symptoms of anxiety over the course of the last two weeks. Steven Zimmerman obtained a score of 3 suggesting minimal anxiety. Steven Zimmerman finds the endorsed symptoms to be not difficult at all. Feeling nervous, anxious, on edge 2  Not being able to stop or control worrying 0  Worrying too much about different things 0  Trouble relaxing 0  Being so restless that it's hard to sit still 0  Becoming easily annoyed or irritable 1  Feeling afraid as if something awful might happen 0  GAD-7 Score 3   Interventions:  Conducted a brief chart review Verbal administration of PHQ-9 and GAD-7 for symptom monitoring Provided empathic reflections and validation Processed thoughts and feelings Psychoeducation provided regarding triggers for emotional eating Focused on rapport building Employed supportive psychotherapy interventions to facilitate reduced distress, and to improve coping skills with identified stressors  DSM-5 Diagnosis: 300.09 (F41.8) Other Specified Anxiety Disorder, Emotional Eating Behaviors  Treatment Goal & Progress: During the initial appointment with this provider, the following treatment goal was established: decrease emotional eating. Progress is limited, as Steven Zimmerman has just begun treatment with this provider; however, he is receptive to the interaction  and interventions and rapport is being established.   Plan: Steven Zimmerman continues to appear able and willing to participate as evidenced by engagement in reciprocal conversation, and asking questions for clarification as appropriate. Steven Zimmerman indicated he will likely be going on vacation in two weeks; therefore, the next appointment will be scheduled in three weeks, which will be via [employer]. The next session will focus on reviewing triggers for emotional eating and working towards the established treatment goal.

## 2019-07-26 ENCOUNTER — Ambulatory Visit (INDEPENDENT_AMBULATORY_CARE_PROVIDER_SITE_OTHER): Payer: 59 | Admitting: Psychology

## 2019-07-26 ENCOUNTER — Other Ambulatory Visit: Payer: Self-pay

## 2019-07-26 DIAGNOSIS — F418 Other specified anxiety disorders: Secondary | ICD-10-CM | POA: Diagnosis not present

## 2019-08-08 NOTE — Progress Notes (Signed)
Office: [phone]  /  Fax: [phone]    Date: August 16, 2019   Appointment Start Time: 3:58pm Duration: 35 minutes Provider: Glennie Zimmerman, Psy.D. Type of Session: Individual Therapy  Location of Patient: Home Location of Provider: Provider's Home Type of Contact: Telepsychological Visit via [employer] WebEx   Session Content: Steven Zimmerman is a 58 y.o. male presenting via [place] for a follow-up appointment to address the previously established treatment goal of decreasing emotional eating. Today's appointment was a telepsychological visit, as this provider's clinic is seeing a limited number of patients for in-person visits due to COVID-19. Therapeutic services will resume to in-person appointments once deemed appropriate. Steven Zimmerman expressed understanding regarding the rationale for telepsychological services, and provided verbal consent for today's appointment. Prior to proceeding with today's appointment, Steven Zimmerman's physical location at the time of this appointment was obtained. Steven Zimmerman reported he was at home and provided the address. In the event of technical difficulties, Steven Zimmerman shared a phone number he could be reached at. Steven Zimmerman and this provider participated in today's telepsychological service. Also, Steven Zimmerman denied anyone else being present in the room or on the WebEx appointment.  This provider conducted a brief check-in and verbally administered the PHQ-9 and GAD-7. Steven Zimmerman shared about his recent vacation. He noted he scheduled an appointment to see Steven Zimmerman. The appointment is scheduled for August 18, 2019. Regarding eating, Steven Zimmerman stated his "schedule for eating has been off," but expressed desire to "get back on track." He also discussed consuming alcohol while on vacation. He explained he took "2.5 or 3" cases of 24 beers each that he shared with his wife and cousin over the course of four days. Aside from vacation, Steven Zimmerman noted a reduction in consuming alcohol and denied  driving after drinking. Moreover, Steven Zimmerman described fatigue and stress are triggers for emotional eating. Psychoeducation regarding pleasurable activities, including its impact on emotional eating and overall well-being was provided to assist with coping. Steven Zimmerman was provided with a handout with various options of pleasurable activities, and was encouraged to engage in one activity a day and additional activities as needed when triggered to emotionally eat. Steven Zimmerman agreed. Steven Zimmerman provided verbal consent during today's appointment for this provider to send the handout about pleasurable activities via e-mail. Overall, Steven Zimmerman was receptive to today's session as evidenced by openness to sharing, responsiveness to feedback, and willingness to engage in pleasurable activities.  Mental Status Examination:  Appearance: neat Behavior: cooperative Mood: euthymic Affect: mood congruent Speech: normal in rate, volume, and tone Eye Contact: appropriate Psychomotor Activity: appropriate Thought Process: linear, logical, and goal directed  Content/Perceptual Disturbances: denies suicidal and homicidal ideation, plan, and intent and no hallucinations, delusions, bizarre thinking or behavior reported or observed Orientation: time, person, place and purpose of appointment Cognition/Sensorium: memory, attention, language, and fund of knowledge intact  Insight: fair Judgment: fair  Structured Assessment Results: The Patient Health Questionnaire-9 (PHQ-9) is a self-report measure that assesses symptoms and severity of depression over the course of the last two weeks. Steven Zimmerman obtained a score of 3 suggesting minimal depression. Steven Zimmerman finds the endorsed symptoms to be somewhat difficult. Little interest or pleasure in doing things 0  Feeling down, depressed, or hopeless 0  Trouble falling or staying asleep, or sleeping too much 0  Feeling tired or having little energy 0  Poor appetite or overeating 3  Feeling  bad about yourself --- or that you are a failure or have let yourself or your family down 0  Trouble concentrating on things, such  as reading the newspaper or watching television 0  Moving or speaking so slowly that other people could have noticed? Or the opposite --- being so fidgety or restless that you have been moving around a lot more than usual 0  Thoughts that you would be better off dead or hurting yourself in some way 0  PHQ-9 Score 3    The Generalized Anxiety Disorder-7 (GAD-7) is a brief self-report measure that assesses symptoms of anxiety over the course of the last two weeks. Steven Zimmerman obtained a score of 1 suggesting minimal anxiety. Steven Zimmerman finds the endorsed symptoms to be not difficult at all. Feeling nervous, anxious, on edge 0  Not being able to stop or control worrying 0  Worrying too much about different things 0  Trouble relaxing 0  Being so restless that it's hard to sit still 0  Becoming easily annoyed or irritable 1  Feeling afraid as if something awful might happen 0  GAD-7 Score 1   Interventions:  Conducted a brief chart review Verbal administration of PHQ-9 and GAD-7 for symptom monitoring Provided empathic reflections and validation Reviewed content from the previous session Psychoeducation provided regarding pleasurable activities Employed supportive psychotherapy interventions to facilitate reduced distress, and to improve coping skills with identified stressors  DSM-5 Diagnosis: 300.09 (F41.8) Other Specified Anxiety Disorder, Emotional Eating Behaviors  Treatment Goal & Progress: During the initial appointment with this provider, the following treatment goal was established: decrease emotional eating. Steven Zimmerman has demonstrated progress in his goal as evidenced by increased awareness of hunger patterns and triggers for emotional eating. Steven Zimmerman also reported demonstrates willingness to engage in pleasurable activities.  Plan: Steven Zimmerman continues to appear  able and willing to participate as evidenced by engagement in reciprocal conversation, and asking questions for clarification as appropriate. Based on Steven Zimmerman's request for a late afternoon appointment and appointment availability, the next appointment will be scheduled in three weeks, which will be via [employer] Web[employer]. The next session will focus on reviewing learned skills, and working towards the established treatment goal.

## 2019-08-15 ENCOUNTER — Encounter (INDEPENDENT_AMBULATORY_CARE_PROVIDER_SITE_OTHER): Payer: Self-pay

## 2019-08-16 ENCOUNTER — Ambulatory Visit (INDEPENDENT_AMBULATORY_CARE_PROVIDER_SITE_OTHER): Payer: 59 | Admitting: Psychology

## 2019-08-16 ENCOUNTER — Other Ambulatory Visit: Payer: Self-pay

## 2019-08-16 DIAGNOSIS — F418 Other specified anxiety disorders: Secondary | ICD-10-CM | POA: Diagnosis not present

## 2019-08-18 ENCOUNTER — Encounter (INDEPENDENT_AMBULATORY_CARE_PROVIDER_SITE_OTHER): Payer: Self-pay | Admitting: Bariatrics

## 2019-08-18 ENCOUNTER — Other Ambulatory Visit: Payer: Self-pay

## 2019-08-18 ENCOUNTER — Ambulatory Visit (INDEPENDENT_AMBULATORY_CARE_PROVIDER_SITE_OTHER): Payer: 59 | Admitting: Bariatrics

## 2019-08-18 VITALS — BP 115/63 | HR 63 | Temp 97.8°F | Ht 69.0 in | Wt 230.0 lb

## 2019-08-18 DIAGNOSIS — Z6834 Body mass index (BMI) 34.0-34.9, adult: Secondary | ICD-10-CM

## 2019-08-18 DIAGNOSIS — E669 Obesity, unspecified: Secondary | ICD-10-CM

## 2019-08-18 DIAGNOSIS — R7303 Prediabetes: Secondary | ICD-10-CM

## 2019-08-18 DIAGNOSIS — F3289 Other specified depressive episodes: Secondary | ICD-10-CM

## 2019-08-18 DIAGNOSIS — E559 Vitamin D deficiency, unspecified: Secondary | ICD-10-CM

## 2019-08-18 DIAGNOSIS — Z9189 Other specified personal risk factors, not elsewhere classified: Secondary | ICD-10-CM | POA: Diagnosis not present

## 2019-08-18 MED ORDER — VITAMIN D (ERGOCALCIFEROL) 1.25 MG (50000 UNIT) PO CAPS: 50000.0000 [IU] | ORAL_CAPSULE | ORAL | 0 refills | Status: DC

## 2019-08-22 ENCOUNTER — Encounter (INDEPENDENT_AMBULATORY_CARE_PROVIDER_SITE_OTHER): Payer: Self-pay | Admitting: Bariatrics

## 2019-08-22 NOTE — Progress Notes (Signed)
Office: [phone]  /  Fax: [phone]   HPI:   Chief Complaint: OBESITY Steven Zimmerman is here to discuss his progress with his obesity treatment plan. He is on the Category 4 plan and is following his eating plan approximately 90% of the time. He states he is exercising 0 minutes 0 times per week. Steven Zimmerman is down 22 lbs from his last visit and doing well overall. He reports sticking to high protein foods. He has an abdominal hernia and will have it repaired in the future. His weight is 230 lb (104.3 kg) today and has had a weight loss of 22 pounds over a period of 8 weeks since his last visit. He has lost 35 lbs since starting treatment with [place].  Pre-Diabetes Steven Zimmerman has a diagnosis of prediabetes based on his elevated Hgb A1c and was informed this puts him at greater risk of developing diabetes. Last A1c 5.7 on 06/06/2019 with an insulin of 12.7. He is not taking metformin currently and continues to work on diet and exercise to decrease risk of diabetes. He denies nausea or hypoglycemia.  Vitamin D deficiency Steven Zimmerman has a diagnosis of Vitamin D deficiency. Last Vitamin D 44.3 on 06/06/2019. He is currently taking prescription Vit D and denies nausea, vomiting or muscle weakness.  At risk for osteopenia and osteoporosis Steven Zimmerman is at higher risk of osteopenia and osteoporosis due to Vitamin D deficiency.   Depression, Other Steven Zimmerman is struggling with emotional eating and using food for comfort to the extent that it is negatively impacting his health. He often snacks when he is not hungry. Steven Zimmerman sometimes feels he is out of control and then feels guilty that he made poor food choices. He has been working on behavior modification techniques to help reduce his emotional eating and has been somewhat successful. Steven Zimmerman is seeing Steven Zimmerman. He shows no sign of suicidal or homicidal ideations.  Depression screen PHQ 2/9 06/06/2019  Decreased Interest 1  Down, Depressed, Hopeless 1  PHQ - 2  Score 2  Altered sleeping 1  Tired, decreased energy 2  Change in appetite 1  Feeling bad or failure about yourself  0  Trouble concentrating 0  Moving slowly or fidgety/restless 0  Suicidal thoughts 0  PHQ-9 Score 6  Difficult doing work/chores Somewhat difficult   ASSESSMENT AND PLAN:  Prediabetes  Vitamin D deficiency  Other depression - with emotional eating   At risk for osteoporosis  Class 1 obesity with serious comorbidity and body mass index (BMI) of 34.0 to 34.9 in adult, unspecified obesity type  PLAN:  Pre-Diabetes Steven Zimmerman will continue to work on weight loss, exercise, and decreasing simple carbohydrates in his diet to help decrease the risk of diabetes. We dicussed metformin including benefits and risks. He was informed that eating too many simple carbohydrates or too many calories at one sitting increases the likelihood of GI side effects. Steven Zimmerman was instructed to decrease carbohydrates, increase protein, and increase activity. He will follow-up with [place] as directed to monitor his progress.  Vitamin D Deficiency Steven Zimmerman was informed that low Vitamin D levels contributes to fatigue and are associated with obesity, breast, and colon cancer. He agrees to continue to take prescription Vit D @ 50,000 IU every week #4 with 0 refills and will follow-up for routine testing of Vitamin D, at least 2-3 times per year. He was informed of the risk of over-replacement of Vitamin D and agrees to not increase his dose unless he discusses this with [place] first. Steven Zimmerman  agrees to follow-up with our clinic in 4 weeks.  At risk for osteopenia and osteoporosis Steven Zimmerman was given extended  (15 minutes) osteoporosis prevention counseling today. He is at risk for osteopenia and osteoporosis due to her Vitamin D deficiency. She was encouraged to take her Vitamin D and follow her higher calcium diet and increase strengthening exercise to help strengthen her bones and decrease her risk of osteopenia  and osteoporosis.  Depression with Emotional Eating Behaviors We discussed behavior modification techniques today to help Steven Zimmerman deal with his emotional eating and depression. Steven Zimmerman will continue seeing Steven Zimmerman and will follow-up as directed.  Obesity Steven Zimmerman is currently in the action stage of change. As such, his goal is to continue with weight loss efforts. He has agreed to follow the Category 4 plan with addition lunch and dinner options. Steven Zimmerman will work on meal planning and intentional eating. Steven Zimmerman has been instructed to consider treadmill and small weights for weight loss and overall health benefits. We discussed the following Behavioral Modification Strategies today: increasing lean protein intake, decreasing simple carbohydrates, increasing vegetables, increase H20 intake, decrease eating out, no skipping meals, work on meal planning and easy cooking plans, keeping healthy foods in the home, and planning for success.  Steven Zimmerman has agreed to follow-up with our clinic in 4 weeks. He was informed of the importance of frequent follow-up visits to maximize his success with intensive lifestyle modifications for his multiple health conditions.  ALLERGIES: No Known Allergies  MEDICATIONS: Current Outpatient Medications on File Prior to Visit  Medication Sig Dispense Refill  . ibuprofen (ADVIL,MOTRIN) 200 MG tablet Take 200 mg by mouth every 6 (six) hours as needed.    . ranitidine (ZANTAC) 150 MG tablet Take 150 mg by mouth daily as needed for heartburn.     No current facility-administered medications on file prior to visit.     PAST MEDICAL HISTORY: Past Medical History:  Diagnosis Date  . Anxiety   . Back pain   . Cancer ([place])    skin cancer  . Colon polyps 2006   hyperplastic polyps  . GERD (gastroesophageal reflux disease)   . History of hiatal hernia    umbilical and hiatal  . Joint pain   . Pneumonia    Viral as a child  . Vitamin D deficiency     PAST  SURGICAL HISTORY: Past Surgical History:  Procedure Laterality Date  . HERNIA REPAIR  2008   x2 last in 2015  . INSERTION OF MESH N/A 01/15/2017   Procedure: INSERTION OF MESH;  Surgeon: Ralene Ok, MD;  Location: [facility];  Service: General;  Laterality: N/A;  . [place]  . VENTRAL HERNIA REPAIR N/A 01/15/2017   Procedure: LAPRASCOPIC ASSISTED OPEN RECURRENT VENTRAL HERNIA REPAIR WITH MESH;  Surgeon: Ralene Ok, MD;  Location: [facility];  Service: General;  Laterality: N/A;    SOCIAL HISTORY: Social History   Tobacco Use  . Smoking status: Current Every Day Smoker    Packs/day: 1.00    Types: Cigarettes  . Smokeless tobacco: Never Used  Substance Use Topics  . Alcohol use: Yes    Comment: no daily but heavy (1 or 2 cases during the weekend)  . Drug use: No    FAMILY HISTORY: Family History  Problem Relation Age of Onset  . Colon cancer Mother   . Diabetes Mother   . High blood pressure Mother   . Stroke Mother   . Depression Mother   . Anxiety disorder  Mother   . Obesity Mother   . Esophageal cancer Father   . Prostate cancer Neg Hx   . Coronary artery disease Neg Hx    ROS: Review of Systems  Gastrointestinal: Negative for nausea and vomiting.  Musculoskeletal:       Negative for muscle weakness.  Endo/Heme/Allergies:       Negative for hypoglycemia.  Psychiatric/Behavioral: Positive for depression. Negative for suicidal ideas.       Negative for homicidal ideas.   PHYSICAL EXAM: Blood pressure 115/63, pulse 63, temperature 97.8 F (36.6 C), temperature source Oral, height 5\' 9"  (1.753 m), weight 230 lb (104.3 kg), SpO2 97 %. Body mass index is 33.97 kg/m. Physical Exam Vitals signs reviewed.  Constitutional:      Appearance: Normal appearance. He is obese.  Cardiovascular:     Rate and Rhythm: Normal rate.     Pulses: Normal pulses.  Pulmonary:     Effort: Pulmonary effort is normal.     Breath sounds: Normal breath sounds.   Musculoskeletal: Normal range of motion.  Skin:    General: Skin is warm and dry.  Neurological:     Mental Status: He is alert and oriented to person, place, and time.  Psychiatric:        Behavior: Behavior normal.   RECENT LABS AND TESTS: BMET    Component Value Date/Time   NA 139 06/06/2019 1010   K 4.3 06/06/2019 1010   CL 101 06/06/2019 1010   CO2 22 06/06/2019 1010   GLUCOSE 82 06/06/2019 1010   GLUCOSE 88 09/16/2018 1603   BUN 14 06/06/2019 1010   CREATININE 0.80 06/06/2019 1010   CREATININE 0.87 02/10/2018 1453   CALCIUM 9.0 06/06/2019 1010   GFRNONAA 99 06/06/2019 1010   GFRAA 115 06/06/2019 1010   Lab Results  Component Value Date   HGBA1C 5.7 (H) 06/06/2019   Lab Results  Component Value Date   INSULIN 12.7 06/06/2019   CBC    Component Value Date/Time   WBC 8.0 09/16/2018 1603   RBC 5.09 09/16/2018 1603   HGB 15.4 09/16/2018 1603   HCT 45.7 09/16/2018 1603   PLT 196.0 09/16/2018 1603   MCV 89.8 09/16/2018 1603   MCH 30.0 01/12/2017 0830   MCHC 33.7 09/16/2018 1603   RDW 13.8 09/16/2018 1603   LYMPHSABS 1.6 05/22/2011 1054   MONOABS 0.6 05/22/2011 1054   EOSABS 0.4 05/22/2011 1054   BASOSABS 0.0 05/22/2011 1054   Iron/TIBC/Ferritin/ %Sat No results found for: IRON, TIBC, FERRITIN, IRONPCTSAT Lipid Panel     Component Value Date/Time   CHOL 179 06/06/2019 1010   TRIG 84 06/06/2019 1010   HDL 52 06/06/2019 1010   CHOLHDL 4 02/10/2018 1453   VLDL 29.0 02/10/2018 1453   LDLCALC 110 (H) 06/06/2019 1010   LDLDIRECT 139.0 09/16/2018 1603   Hepatic Function Panel     Component Value Date/Time   PROT 6.5 06/06/2019 1010   ALBUMIN 4.0 06/06/2019 1010   AST 18 06/06/2019 1010   ALT 21 06/06/2019 1010   ALKPHOS 58 06/06/2019 1010   BILITOT 0.3 06/06/2019 1010      Component Value Date/Time   TSH 1.18 09/16/2018 1603   TSH 1.96 05/22/2011 1054   Results for VISHAAL, KLARE (MRN [id number]) as of 08/22/2019 12:12  Ref. Range 06/06/2019  10:10  Vitamin D, 25-Hydroxy Latest Ref Range: 30.0 - 100.0 ng/mL 44.3   OBESITY BEHAVIORAL INTERVENTION VISIT  Today's visit was #3  Starting weight: 265  lbs Starting date: 06/06/2019 Today's weight: 230 lbs  Today's date: 08/18/2019 Total lbs lost to date: 35    08/18/2019  Height 5\' 9"  (1.753 m)  Weight 230 lb (104.3 kg)  BMI (Calculated) 33.95  BLOOD PRESSURE - SYSTOLIC [id number]  BLOOD PRESSURE - DIASTOLIC 63   Body Fat % [id number] %  Total Body Water (lbs) 112 lbs   ASK: We discussed the diagnosis of obesity with Leane Platt today and Steven Zimmerman agreed to give [place] permission to discuss obesity behavioral modification therapy today.  ASSESS: Hoyte has the diagnosis of obesity and his BMI today is 34.1. Jackman is in the action stage of change.   ADVISE: Jeylan was educated on the multiple health risks of obesity as well as the benefit of weight loss to improve his health. He was advised of the need for long term treatment and the importance of lifestyle modifications to improve his current health and to decrease his risk of future health problems.  AGREE: Multiple dietary modification options and treatment options were discussed and  Steven Zimmerman agreed to follow the recommendations documented in the above note.  ARRANGE: Donnavon was educated on the importance of frequent visits to treat obesity as outlined per CMS and USPSTF guidelines and agreed to schedule his next follow up appointment today.  Migdalia Dk, am acting as Location manager for [employer], DO  I have reviewed the above documentation for accuracy and completeness, and I agree with the above. -Jearld Lesch, DO

## 2019-09-05 NOTE — Progress Notes (Unsigned)
Office: [phone]  /  Fax: [phone]    Date: September 07, 2019   Appointment Start Time: *** Duration: *** minutes Provider: Glennie Isle, Psy.D. Type of Session: Individual Therapy  Location of Patient: *** Location of Provider: Provider's Home Type of Contact: Telepsychological Visit via [employer] WebEx   Session Content: Of note, this provider called Steven Zimmerman at 4:02pm as he did not present for the [facility] appointment. *** The e-mail with the secure link was re-sent. As such, today's appointment was initiated *** minutes late.  Steven Zimmerman is a 58 y.o. male presenting via [place] for a follow-up appointment to address the previously established treatment goal of decreasing emotional eating. Today's appointment was a telepsychological visit, as it is an option for appointments to reduce exposure to COVID-19. Steven Zimmerman expressed understanding regarding the rationale for telepsychological services, and provided verbal consent for today's appointment. Prior to proceeding with today's appointment, Steven Zimmerman's physical location at the time of this appointment was obtained. Steven Zimmerman reported he was at *** and provided the address. In the event of technical difficulties, Brain shared a phone number he could be reached at. Steven Zimmerman and this provider participated in today's telepsychological service. Also, Steven Zimmerman denied anyone else being present in the room or on the WebEx appointment ***.  This provider conducted a brief check-in and verbally administered the PHQ-9 and GAD-7. *** Steven Zimmerman was receptive to today's session as evidenced by openness to sharing, responsiveness to feedback, and ***.  Mental Status Examination:  Appearance: {Appearance:22431} Behavior: {Behavior:22445} Mood: {gbmood:21757} Affect: {Affect:22436} Speech: {Speech:22432} Eye Contact: {Eye Contact:22433} Psychomotor Activity: {Motor Activity:22434} Thought Process: {thought process:22448}  Content/Perceptual Disturbances:  {disturbances:22451} Orientation: {Orientation:22437} Cognition/Sensorium: {gbcognition:22449} Insight: {Insight:22446} Judgment: {Insight:22446}  Structured Assessment Results: The Patient Health Questionnaire-9 (PHQ-9) is a self-report measure that assesses symptoms and severity of depression over the course of the last two weeks. Steven Zimmerman obtained a score of *** suggesting {GBPHQ9SEVERITY:21752}. Steven Zimmerman finds the endorsed symptoms to be {gbphq9difficulty:21754}. Little interest or pleasure in doing things ***  Feeling down, depressed, or hopeless ***  Trouble falling or staying asleep, or sleeping too much ***  Feeling tired or having little energy ***  Poor appetite or overeating ***  Feeling bad about yourself --- or that you are a failure or have let yourself or your family down ***  Trouble concentrating on things, such as reading the newspaper or watching television ***  Moving or speaking so slowly that other people could have noticed? Or the opposite --- being so fidgety or restless that you have been moving around a lot more than usual ***  Thoughts that you would be better off dead or hurting yourself in some way ***  PHQ-9 Score ***    The Generalized Anxiety Disorder-7 (GAD-7) is a brief self-report measure that assesses symptoms of anxiety over the course of the last two weeks. Steven Zimmerman obtained a score of *** suggesting {gbgad7severity:21753}. Steven Zimmerman finds the endorsed symptoms to be {gbphq9difficulty:21754}. Feeling nervous, anxious, on edge ***  Not being able to stop or control worrying ***  Worrying too much about different things ***  Trouble relaxing ***  Being so restless that it's hard to sit still ***  Becoming easily annoyed or irritable ***  Feeling afraid as if something awful might happen ***  GAD-7 Score ***   Interventions:  {Interventions:22172}  DSM-5 Diagnosis: 300.09 (F41.8) Other Specified Anxiety Disorder, Emotional Eating Behaviors  Treatment  Goal & Progress: During the initial appointment with this provider, the following treatment goal was established: decrease  emotional eating. Steven Zimmerman has demonstrated progress in his goal as evidenced by {gbtxprogress:22839}. Steven Zimmerman also reported {gbtxprogress2:22951}.  Plan: Steven Zimmerman continues to appear able and willing to participate as evidenced by engagement in reciprocal conversation, and asking questions for clarification as appropriate. The next appointment will be scheduled in {gbweeks:21758}, which will be via [employer]. The next session will focus on reviewing learned skills, and working towards the established treatment goal.***

## 2019-09-07 ENCOUNTER — Ambulatory Visit (INDEPENDENT_AMBULATORY_CARE_PROVIDER_SITE_OTHER): Payer: 59 | Admitting: Psychology

## 2019-09-07 ENCOUNTER — Encounter (INDEPENDENT_AMBULATORY_CARE_PROVIDER_SITE_OTHER): Payer: Self-pay

## 2019-09-07 ENCOUNTER — Other Ambulatory Visit: Payer: Self-pay

## 2019-09-07 ENCOUNTER — Telehealth (INDEPENDENT_AMBULATORY_CARE_PROVIDER_SITE_OTHER): Payer: Self-pay | Admitting: Psychology

## 2019-09-07 NOTE — Telephone Encounter (Signed)
  Office: [phone]  /  Fax: [phone]  Date of Call: September 07, 2019  Time of Call: 4:02pm Provider: Glennie Isle, PsyD  CONTENT: This provider called Steven Zimmerman to check-in as he did not present for today's Webex appointment at 4:00pm. A HIPAA compliant voicemail was left requesting a call back. Of note, this provider stayed on the [facility] appointment for 7 minutes prior to signing off, which is 2 additional minutes past the clinic's 5 minute grace period policy.    PLAN: This provider will wait for Steven Zimmerman to call back. If deemed necessary, this provider or the provider's clinic will call Brianna again in approximately one week.

## 2019-09-15 ENCOUNTER — Other Ambulatory Visit: Payer: Self-pay

## 2019-09-15 ENCOUNTER — Ambulatory Visit (INDEPENDENT_AMBULATORY_CARE_PROVIDER_SITE_OTHER): Payer: 59 | Admitting: Bariatrics

## 2019-09-15 VITALS — BP 106/65 | HR 73 | Temp 98.4°F | Ht 69.0 in | Wt 226.0 lb

## 2019-09-15 DIAGNOSIS — E669 Obesity, unspecified: Secondary | ICD-10-CM

## 2019-09-15 DIAGNOSIS — F3289 Other specified depressive episodes: Secondary | ICD-10-CM | POA: Diagnosis not present

## 2019-09-15 DIAGNOSIS — Z6833 Body mass index (BMI) 33.0-33.9, adult: Secondary | ICD-10-CM | POA: Diagnosis not present

## 2019-09-15 DIAGNOSIS — R7303 Prediabetes: Secondary | ICD-10-CM

## 2019-09-19 ENCOUNTER — Encounter (INDEPENDENT_AMBULATORY_CARE_PROVIDER_SITE_OTHER): Payer: Self-pay | Admitting: Bariatrics

## 2019-09-19 NOTE — Progress Notes (Signed)
Office: [phone]  /  Fax: [phone]   HPI:   Chief Complaint: OBESITY Yonis is here to discuss his progress with his obesity treatment plan. He is on the Category 4 plan and is following his eating plan approximately 80-90% of the time. He states he is doing physical work 40 hours 5 times per week. Kevonte has lost 4 lbs. He has not struggled for the most part. He does state he is not getting enough water. His weight is 226 lb (102.5 kg) today and has had a weight loss of 4 pounds over a period of 4 weeks since his last visit. He has lost 39 lbs since starting treatment with [place].  Pre-Diabetes Antino has a diagnosis of prediabetes based on his elevated Hgb A1c and was informed this puts him at greater risk of developing diabetes. Last A1c 5.7 on 06/06/2019 with an insulin of 12.7. He is not taking any medications currently and continues to work on diet and exercise to decrease risk of diabetes. He denies nausea or hypoglycemia.  Depression, Other Zyian is struggling with emotional eating and using food for comfort to the extent that it is negatively impacting his health. He often snacks when he is not hungry. Alema sometimes feels he is out of control and then feels guilty that he made poor food choices. He has been working on behavior modification techniques to help reduce his emotional eating and has been somewhat successful. Drevyn has seen Dr. Mallie Mussel in the past. He shows no sign of suicidal or homicidal ideations.  Depression screen PHQ 2/9 06/06/2019  Decreased Interest 1  Down, Depressed, Hopeless 1  PHQ - 2 Score 2  Altered sleeping 1  Tired, decreased energy 2  Change in appetite 1  Feeling bad or failure about yourself  0  Trouble concentrating 0  Moving slowly or fidgety/restless 0  Suicidal thoughts 0  PHQ-9 Score 6  Difficult doing work/chores Somewhat difficult   ASSESSMENT AND PLAN:  Prediabetes  Other depression - with emotional eating  Class 1  obesity with serious comorbidity and body mass index (BMI) of 33.0 to 33.9 in adult, unspecified obesity type  PLAN:  Pre-Diabetes Elgie will continue to work on weight loss, exercise, and decreasing simple carbohydrates in his diet to help decrease the risk of diabetes. We dicussed metformin including benefits and risks. He was informed that eating too many simple carbohydrates or too many calories at one sitting increases the likelihood of GI side effects. Gurshan was instructed to decrease carbohydrates, increase protein, increase healthy fats, and increase activity. He will follow-up with [place] as directed to monitor his progress.   Depression, Other We discussed CBT techniques with [name]. He will follow-up as directed to monitor his progress.  I spent > than 50% of the 15 minute visit on counseling as documented in the note.  Obesity Jiho is currently in the action stage of change. As such, his goal is to continue with weight loss efforts. He has agreed to follow the Category 4 plan. Kaelan will work on meal planning, intentional eating, alternate with water and coffee and to increase his water intake, and decrease beer (calories). Drago has been instructed to remain busy and will begin treadmill walking for weight loss and overall health benefits. We discussed the following Behavioral Modification Strategies today: increasing lean protein intake, decreasing simple carbohydrates, increasing vegetables, increase H20 intake, decrease eating out, no skipping meals, work on meal planning and easy cooking plans, and keeping healthy foods in  the home.  Caine has agreed to follow-up with our clinic in 4 weeks. He was informed of the importance of frequent follow-up visits to maximize his success with intensive lifestyle modifications for his multiple health conditions.  ALLERGIES: No Known Allergies  MEDICATIONS: Current Outpatient Medications on File Prior to Visit  Medication Sig  Dispense Refill  . ibuprofen (ADVIL,MOTRIN) 200 MG tablet Take 200 mg by mouth every 6 (six) hours as needed.    . Vitamin D, Ergocalciferol, (DRISDOL) 1.25 MG (50000 UT) CAPS capsule Take 1 capsule (50,000 Units total) by mouth every 7 (seven) days. 4 capsule 0  . ranitidine (ZANTAC) 150 MG tablet Take 150 mg by mouth daily as needed for heartburn.     No current facility-administered medications on file prior to visit.     PAST MEDICAL HISTORY: Past Medical History:  Diagnosis Date  . Anxiety   . Back pain   . Cancer ([place])    skin cancer  . Colon polyps 2006   hyperplastic polyps  . GERD (gastroesophageal reflux disease)   . History of hiatal hernia    umbilical and hiatal  . Joint pain   . Pneumonia    Viral as a child  . Vitamin D deficiency     PAST SURGICAL HISTORY: Past Surgical History:  Procedure Laterality Date  . HERNIA REPAIR  2008   x2 last in 2015  . INSERTION OF MESH N/A 01/15/2017   Procedure: INSERTION OF MESH;  Surgeon: Ralene Ok, MD;  Location: [facility];  Service: General;  Laterality: N/A;  . [place]  . VENTRAL HERNIA REPAIR N/A 01/15/2017   Procedure: LAPRASCOPIC ASSISTED OPEN RECURRENT VENTRAL HERNIA REPAIR WITH MESH;  Surgeon: Ralene Ok, MD;  Location: [facility];  Service: General;  Laterality: N/A;    SOCIAL HISTORY: Social History   Tobacco Use  . Smoking status: Current Every Day Smoker    Packs/day: 1.00    Types: Cigarettes  . Smokeless tobacco: Never Used  Substance Use Topics  . Alcohol use: Yes    Comment: no daily but heavy (1 or 2 cases during the weekend)  . Drug use: No    FAMILY HISTORY: Family History  Problem Relation Age of Onset  . Colon cancer Mother   . Diabetes Mother   . High blood pressure Mother   . Stroke Mother   . Depression Mother   . Anxiety disorder Mother   . Obesity Mother   . Esophageal cancer Father   . Prostate cancer Neg Hx   . Coronary artery disease Neg Hx    ROS: Review of  Systems  Gastrointestinal: Negative for nausea.  Endo/Heme/Allergies:       Negative for hypoglycemia.  Psychiatric/Behavioral: Positive for depression. Negative for suicidal ideas.       Negative for homicidal ideas.   PHYSICAL EXAM: Blood pressure 106/65, pulse 73, temperature 98.4 F (36.9 C), temperature source Oral, height 5\' 9"  (1.753 m), weight 226 lb (102.5 kg), SpO2 96 %. Body mass index is 33.37 kg/m. Physical Exam Vitals signs reviewed.  Constitutional:      Appearance: Normal appearance. He is obese.  Cardiovascular:     Rate and Rhythm: Normal rate.     Pulses: Normal pulses.  Pulmonary:     Effort: Pulmonary effort is normal.     Breath sounds: Normal breath sounds.  Musculoskeletal: Normal range of motion.  Skin:    General: Skin is warm and dry.  Neurological:  Mental Status: He is alert and oriented to person, place, and time.  Psychiatric:        Behavior: Behavior normal.   RECENT LABS AND TESTS: BMET    Component Value Date/Time   NA 139 06/06/2019 1010   K 4.3 06/06/2019 1010   CL 101 06/06/2019 1010   CO2 22 06/06/2019 1010   GLUCOSE 82 06/06/2019 1010   GLUCOSE 88 09/16/2018 1603   BUN 14 06/06/2019 1010   CREATININE 0.80 06/06/2019 1010   CREATININE 0.87 02/10/2018 1453   CALCIUM 9.0 06/06/2019 1010   GFRNONAA 99 06/06/2019 1010   GFRAA 115 06/06/2019 1010   Lab Results  Component Value Date   HGBA1C 5.7 (H) 06/06/2019   Lab Results  Component Value Date   INSULIN 12.7 06/06/2019   CBC    Component Value Date/Time   WBC 8.0 09/16/2018 1603   RBC 5.09 09/16/2018 1603   HGB 15.4 09/16/2018 1603   HCT 45.7 09/16/2018 1603   PLT 196.0 09/16/2018 1603   MCV 89.8 09/16/2018 1603   MCH 30.0 01/12/2017 0830   MCHC 33.7 09/16/2018 1603   RDW 13.8 09/16/2018 1603   LYMPHSABS 1.6 05/22/2011 1054   MONOABS 0.6 05/22/2011 1054   EOSABS 0.4 05/22/2011 1054   BASOSABS 0.0 05/22/2011 1054   Iron/TIBC/Ferritin/ %Sat No results found  for: IRON, TIBC, FERRITIN, IRONPCTSAT Lipid Panel     Component Value Date/Time   CHOL 179 06/06/2019 1010   TRIG 84 06/06/2019 1010   HDL 52 06/06/2019 1010   CHOLHDL 4 02/10/2018 1453   VLDL 29.0 02/10/2018 1453   LDLCALC 110 (H) 06/06/2019 1010   LDLDIRECT 139.0 09/16/2018 1603   Hepatic Function Panel     Component Value Date/Time   PROT 6.5 06/06/2019 1010   ALBUMIN 4.0 06/06/2019 1010   AST 18 06/06/2019 1010   ALT 21 06/06/2019 1010   ALKPHOS 58 06/06/2019 1010   BILITOT 0.3 06/06/2019 1010      Component Value Date/Time   TSH 1.18 09/16/2018 1603   TSH 1.96 05/22/2011 1054   Results for ASUNCION, GUIDER (MRN [id number]) as of 09/19/2019 09:07  Ref. Range 06/06/2019 10:10  Vitamin D, 25-Hydroxy Latest Ref Range: 30.0 - 100.0 ng/mL 44.3   OBESITY BEHAVIORAL INTERVENTION VISIT  Today's visit was #4  Starting weight: 265 lbs Starting date: 06/06/2019 Today's weight: 226 lbs  Today's date: 09/15/2019 Total lbs lost to date: 39    09/15/2019  Height 5\' 9"  (1.753 m)  Weight 226 lb (102.5 kg)  BMI (Calculated) 33.36  BLOOD PRESSURE - SYSTOLIC [id number]  BLOOD PRESSURE - DIASTOLIC 65   Body Fat % 123XX123 %  Total Body Water (lbs) 111.8 lbs   ASK: We discussed the diagnosis of obesity with Leane Platt today and [name] agreed to give [place] permission to discuss obesity behavioral modification therapy today.  ASSESS: Sayf has the diagnosis of obesity and his BMI today is 33.4. Adain is in the action stage of change.   ADVISE: Ricci was educated on the multiple health risks of obesity as well as the benefit of weight loss to improve his health. He was advised of the need for long term treatment and the importance of lifestyle modifications to improve his current health and to decrease his risk of future health problems.  AGREE: Multiple dietary modification options and treatment options were discussed and  Bryten agreed to follow the recommendations documented  in the above note.  ARRANGE: Jawaad was educated  on the importance of frequent visits to treat obesity as outlined per CMS and USPSTF guidelines and agreed to schedule his next follow up appointment today.  Migdalia Dk, am acting as Location manager for [employer], DO   I have reviewed the above documentation for accuracy and completeness, and I agree with the above. -Jearld Lesch, DO

## 2019-10-12 ENCOUNTER — Ambulatory Visit (INDEPENDENT_AMBULATORY_CARE_PROVIDER_SITE_OTHER): Payer: 59 | Admitting: Bariatrics

## 2019-10-12 ENCOUNTER — Other Ambulatory Visit: Payer: Self-pay

## 2019-10-12 ENCOUNTER — Encounter (INDEPENDENT_AMBULATORY_CARE_PROVIDER_SITE_OTHER): Payer: Self-pay | Admitting: Bariatrics

## 2019-10-12 VITALS — BP 113/66 | HR 76 | Temp 98.0°F | Ht 69.0 in | Wt 232.0 lb

## 2019-10-12 DIAGNOSIS — Z6834 Body mass index (BMI) 34.0-34.9, adult: Secondary | ICD-10-CM

## 2019-10-12 DIAGNOSIS — E669 Obesity, unspecified: Secondary | ICD-10-CM

## 2019-10-12 DIAGNOSIS — Z9189 Other specified personal risk factors, not elsewhere classified: Secondary | ICD-10-CM

## 2019-10-12 DIAGNOSIS — E559 Vitamin D deficiency, unspecified: Secondary | ICD-10-CM

## 2019-10-12 DIAGNOSIS — R7303 Prediabetes: Secondary | ICD-10-CM

## 2019-10-12 MED ORDER — VITAMIN D (ERGOCALCIFEROL) 1.25 MG (50000 UNIT) PO CAPS: 50000.0000 [IU] | ORAL_CAPSULE | ORAL | 0 refills | Status: DC

## 2019-10-13 ENCOUNTER — Encounter (INDEPENDENT_AMBULATORY_CARE_PROVIDER_SITE_OTHER): Payer: Self-pay | Admitting: Bariatrics

## 2019-10-13 NOTE — Progress Notes (Signed)
Office: [phone]  /  Fax: [phone]   HPI:   Chief Complaint: OBESITY Steven Zimmerman is here to discuss his progress with his obesity treatment plan. He is on the Category 2 plan and is following his eating plan approximately 80% of the time. He states he is exercising 0 minutes 0 times per week. Steven Zimmerman is up 6 lbs. 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  His weight is 232 lb (105.2 kg) today and has had a weight gain of 6 lbs since his last visit. He has lost 33 lbs since starting treatment with [place].  Vitamin D deficiency Steven Zimmerman has a diagnosis of Vitamin D deficiency. He is currently taking prescription Vit D and denies nausea, vomiting or muscle weakness.  At risk for osteopenia and osteoporosis Steven Zimmerman is at higher risk of osteopenia and osteoporosis due to Vitamin D deficiency.   Pre-Diabetes Steven Zimmerman has a diagnosis of prediabetes based on his elevated Hgb A1c and was informed this puts him at greater risk of developing diabetes. Last A1c 5.7 on 06/06/2019 with an insulin of 12.7. He is on no medications currently. He denies nausea or hypoglycemia.  ASSESSMENT AND PLAN:  Prediabetes  Vitamin D deficiency - Plan: Vitamin D, Ergocalciferol, (DRISDOL) 1.25 MG (50000 UT) CAPS capsule  At risk for osteoporosis  Class 1 obesity with serious comorbidity and body mass index (BMI) of 34.0 to 34.9 in adult, unspecified obesity type  PLAN:  Vitamin D Deficiency Steven Zimmerman was informed that low Vitamin D levels contributes to fatigue and are associated with obesity, breast, and colon cancer. He agrees to continue to take prescription Vit D @ 50,000 IU every week #4 with 0 refills and will follow-up for routine testing of Vitamin D, at least 2-3 times per year. He was informed of the risk of over-replacement of Vitamin D and agrees to not increase his dose unless he discusses this with [place] first. Steven Zimmerman agrees to follow-up with our clinic in 2-3 weeks.  At risk for osteopenia and osteoporosis Steven Zimmerman was given extended  (15 minutes) osteoporosis prevention counseling today. Steven Zimmerman is at risk for osteopenia and osteoporosis due to his Vitamin D deficiency. He was encouraged to take his Vitamin D and follow his higher calcium diet and increase strengthening exercise to help strengthen his bones and decrease his risk of osteopenia and osteoporosis.  Pre-Diabetes Steven Zimmerman will continue to work on weight loss, exercise, and decreasing simple carbohydrates in his diet to help decrease the risk of diabetes. We dicussed metformin including benefits and risks. He was informed that eating too many simple carbohydrates or too many calories at one sitting increases the likelihood of GI side effects. Steven Zimmerman will decrease carbohydrates, increase protein and healthy fats.  Obesity Steven Zimmerman is currently in the action stage of change. As such, his goal is to continue with weight loss efforts. He has agreed to follow the Category 2 plan alternating with the Pescatarian eating plan. He will journal 1200 calories and 80 grams of protein. Steven Zimmerman will work on meal planning, decreasing snacking, decreasing alcohol intake, increasing his water intake, and stopping soft drinks. Steven Zimmerman has been instructed to work up to a goal of 150 minutes of combined cardio and strengthening exercise per week for weight loss and overall health benefits. We discussed the following Behavioral Modification Strategies today: increasing lean protein intake, decreasing simple carbohydrates, increasing vegetables, increase H20 intake, decrease eating out, no skipping meals, work on meal planning and easy  cooking plans, keeping healthy foods in the home, travel eating strategies, holiday eating strategies, celebration eating strategies, and avoiding temptations.  Steven Zimmerman has agreed to follow-up with our clinic in 2-3 weeks. He was informed of the importance of frequent follow-up visits to  maximize his success with intensive lifestyle modifications for his multiple health conditions.  ALLERGIES: No Known Allergies  MEDICATIONS: Current Outpatient Medications on File Prior to Visit  Medication Sig Dispense Refill  . ibuprofen (ADVIL,MOTRIN) 200 MG tablet Take 200 mg by mouth every 6 (six) hours as needed.    . ranitidine (ZANTAC) 150 MG tablet Take 150 mg by mouth daily as needed for heartburn.     No current facility-administered medications on file prior to visit.     PAST MEDICAL HISTORY: Past Medical History:  Diagnosis Date  . Anxiety   . Back pain   . Cancer ([place])    skin cancer  . Colon polyps 2006   hyperplastic polyps  . GERD (gastroesophageal reflux disease)   . History of hiatal hernia    umbilical and hiatal  . Joint pain   . Pneumonia    Viral as a child  . Vitamin D deficiency     PAST SURGICAL HISTORY: Past Surgical History:  Procedure Laterality Date  . HERNIA REPAIR  2008   x2 last in 2015  . INSERTION OF MESH N/A 01/15/2017   Procedure: INSERTION OF MESH;  Surgeon: Ralene Ok, MD;  Location: [facility];  Service: General;  Laterality: N/A;  . [place]  . VENTRAL HERNIA REPAIR N/A 01/15/2017   Procedure: LAPRASCOPIC ASSISTED OPEN RECURRENT VENTRAL HERNIA REPAIR WITH MESH;  Surgeon: Ralene Ok, MD;  Location: [facility];  Service: General;  Laterality: N/A;    SOCIAL HISTORY: Social History   Tobacco Use  . Smoking status: Current Every Day Smoker    Packs/day: 1.00    Types: Cigarettes  . Smokeless tobacco: Never Used  Substance Use Topics  . Alcohol use: Yes    Comment: no daily but heavy (1 or 2 cases during the weekend)  . Drug use: No    FAMILY HISTORY: Family History  Problem Relation Age of Onset  . Colon cancer Mother   . Diabetes Mother   . High blood pressure Mother   . Stroke Mother   . Depression Mother   . Anxiety disorder Mother   . Obesity Mother   . Esophageal cancer Father   . Prostate cancer  Neg Hx   . Coronary artery disease Neg Hx    ROS: Review of Systems  Gastrointestinal: Negative for nausea and vomiting.  Musculoskeletal:       Negative for muscle weakness.  Endo/Heme/Allergies:       Negative for hypoglycemia.   PHYSICAL EXAM: Blood pressure 113/66, pulse 76, temperature 98 F (36.7 C), height 5\' 9"  (1.753 m), weight 232 lb (105.2 kg), SpO2 98 %. Body mass index is 34.26 kg/m. Physical Exam Vitals signs reviewed.  Constitutional:      Appearance: Normal appearance. He is obese.  Cardiovascular:     Rate and Rhythm: Normal rate.     Pulses: Normal pulses.  Pulmonary:     Effort: Pulmonary effort is normal.     Breath sounds: Normal breath sounds.  Musculoskeletal: Normal range of motion.  Skin:    General: Skin is warm and dry.  Neurological:     Mental Status: He is alert and oriented to person, place, and time.  Psychiatric:  Behavior: Behavior normal.   RECENT LABS AND TESTS: BMET    Component Value Date/Time   NA 139 06/06/2019 1010   K 4.3 06/06/2019 1010   CL 101 06/06/2019 1010   CO2 22 06/06/2019 1010   GLUCOSE 82 06/06/2019 1010   GLUCOSE 88 09/16/2018 1603   BUN 14 06/06/2019 1010   CREATININE 0.80 06/06/2019 1010   CREATININE 0.87 02/10/2018 1453   CALCIUM 9.0 06/06/2019 1010   GFRNONAA 99 06/06/2019 1010   GFRAA 115 06/06/2019 1010   Lab Results  Component Value Date   HGBA1C 5.7 (H) 06/06/2019   Lab Results  Component Value Date   INSULIN 12.7 06/06/2019   CBC    Component Value Date/Time   WBC 8.0 09/16/2018 1603   RBC 5.09 09/16/2018 1603   HGB 15.4 09/16/2018 1603   HCT 45.7 09/16/2018 1603   PLT 196.0 09/16/2018 1603   MCV 89.8 09/16/2018 1603   MCH 30.0 01/12/2017 0830   MCHC 33.7 09/16/2018 1603   RDW 13.8 09/16/2018 1603   LYMPHSABS 1.6 05/22/2011 1054   MONOABS 0.6 05/22/2011 1054   EOSABS 0.4 05/22/2011 1054   BASOSABS 0.0 05/22/2011 1054   Iron/TIBC/Ferritin/ %Sat No results found for: IRON,  TIBC, FERRITIN, IRONPCTSAT Lipid Panel     Component Value Date/Time   CHOL 179 06/06/2019 1010   TRIG 84 06/06/2019 1010   HDL 52 06/06/2019 1010   CHOLHDL 4 02/10/2018 1453   VLDL 29.0 02/10/2018 1453   LDLCALC 110 (H) 06/06/2019 1010   LDLDIRECT 139.0 09/16/2018 1603   Hepatic Function Panel     Component Value Date/Time   PROT 6.5 06/06/2019 1010   ALBUMIN 4.0 06/06/2019 1010   AST 18 06/06/2019 1010   ALT 21 06/06/2019 1010   ALKPHOS 58 06/06/2019 1010   BILITOT 0.3 06/06/2019 1010      Component Value Date/Time   TSH 1.18 09/16/2018 1603   TSH 1.96 05/22/2011 1054   Results for ARNE, MASSENGILL (MRN [id number]) as of 10/13/2019 07:37  Ref. Range 06/06/2019 10:10  Vitamin D, 25-Hydroxy Latest Ref Range: 30.0 - 100.0 ng/mL 44.3   OBESITY BEHAVIORAL INTERVENTION VISIT  Today's visit was #5  Starting weight: 265 lbs Starting date: 06/06/2019 Today's weight: 232 lbs  Today's date: 10/12/2019 Total lbs lost to date: 33     10/12/2019  Height 5\' 9"  (1.753 m)  Weight 232 lb (105.2 kg)  BMI (Calculated) 34.24  BLOOD PRESSURE - SYSTOLIC [id number]  BLOOD PRESSURE - DIASTOLIC 66   Body Fat % 123XX123 %  Total Body Water (lbs) 111.4 lbs   ASK: We discussed the diagnosis of obesity with Steven Zimmerman today and Steven Zimmerman agreed to give [place] permission to discuss obesity behavioral modification therapy today.  ASSESS: Steven Zimmerman has the diagnosis of obesity and his BMI today is 34.4. Steven Zimmerman is in the action stage of change.   ADVISE: Steven Zimmerman was educated on the multiple health risks of obesity as well as the benefit of weight loss to improve his health. He was advised of the need for long term treatment and the importance of lifestyle modifications to improve his current health and to decrease his risk of future health problems.  AGREE: Multiple dietary modification options and treatment options were discussed and  Steven Zimmerman agreed to follow the recommendations documented in the  above note.  ARRANGE: Steven Zimmerman was educated on the importance of frequent visits to treat obesity as outlined per CMS and USPSTF guidelines and agreed to schedule  his next follow up appointment today.  Migdalia Dk, am acting as Location manager for [employer], DO   I have reviewed the above documentation for accuracy and completeness, and I agree with the above. -Jearld Lesch, DO

## 2019-11-01 ENCOUNTER — Encounter (INDEPENDENT_AMBULATORY_CARE_PROVIDER_SITE_OTHER): Payer: Self-pay

## 2019-11-02 ENCOUNTER — Ambulatory Visit (INDEPENDENT_AMBULATORY_CARE_PROVIDER_SITE_OTHER): Payer: 59 | Admitting: Bariatrics

## 2019-11-02 ENCOUNTER — Encounter (INDEPENDENT_AMBULATORY_CARE_PROVIDER_SITE_OTHER): Payer: Self-pay | Admitting: Bariatrics

## 2019-11-02 ENCOUNTER — Other Ambulatory Visit: Payer: Self-pay

## 2019-11-02 VITALS — BP 106/66 | HR 81 | Temp 98.3°F | Ht 69.0 in | Wt 227.0 lb

## 2019-11-02 DIAGNOSIS — Z6833 Body mass index (BMI) 33.0-33.9, adult: Secondary | ICD-10-CM

## 2019-11-02 DIAGNOSIS — Z9189 Other specified personal risk factors, not elsewhere classified: Secondary | ICD-10-CM

## 2019-11-02 DIAGNOSIS — R7303 Prediabetes: Secondary | ICD-10-CM | POA: Diagnosis not present

## 2019-11-02 DIAGNOSIS — E669 Obesity, unspecified: Secondary | ICD-10-CM | POA: Diagnosis not present

## 2019-11-02 DIAGNOSIS — E559 Vitamin D deficiency, unspecified: Secondary | ICD-10-CM | POA: Diagnosis not present

## 2019-11-02 MED ORDER — VITAMIN D (ERGOCALCIFEROL) 1.25 MG (50000 UNIT) PO CAPS: 50000.0000 [IU] | ORAL_CAPSULE | ORAL | 0 refills | Status: DC

## 2019-11-02 NOTE — Progress Notes (Signed)
Office: [phone]  /  Fax: [phone]   HPI:   Chief Complaint: OBESITY Koston is here to discuss his progress with his obesity treatment plan. He is on the Category 2 plan and is following his eating plan approximately 80% of the time. He states he is exercising 0 minutes 0 times per week. Jantz is down 5 lbs. He reports drinking adequate water and getting adequate protein. His weight is 227 lb (103 kg) today and has had a weight loss of 5 pounds over a period of 3 weeks since his last visit. He has lost 38 lbs since starting treatment with [place].  Vitamin D deficiency Jeanmichael has a diagnosis of Vitamin D deficiency. Last Vitamin D 44.3 on 06/06/2019. He is currently taking prescription Vit D and denies nausea, vomiting or muscle weakness.  At risk for osteopenia and osteoporosis Carrson is at higher risk of osteopenia and osteoporosis due to Vitamin D deficiency.   Pre-Diabetes Eulices has a diagnosis of prediabetes based on his elevated Hgb A1c and was informed this puts him at greater risk of developing diabetes. Last A1c 5.7 on 06/06/2019 with an insulin of 12.7. He is not taking metformin currently and continues to work on diet and exercise to decrease risk of diabetes. He denies nausea or hypoglycemia.  ASSESSMENT AND PLAN:  Vitamin D deficiency - Plan: Vitamin D, Ergocalciferol, (DRISDOL) 1.25 MG (50000 UT) CAPS capsule  Prediabetes  At risk for osteoporosis  Class 1 obesity with serious comorbidity and body mass index (BMI) of 33.0 to 33.9 in adult, unspecified obesity type  PLAN:  Vitamin D Deficiency Jayc was informed that low Vitamin D levels contributes to fatigue and are associated with obesity, breast, and colon cancer. He agrees to continue to take prescription Vit D @ 50,000 IU every week #4 with 0 refills and will follow-up for routine testing of Vitamin D, at least 2-3 times per year. He was informed of the risk of over-replacement of Vitamin D and  agrees to not increase his dose unless he discusses this with [place] first. Tyden agrees to follow-up with our clinic in 2-4 weeks.  At risk for osteopenia and osteoporosis Tiernan is at risk for osteopenia and osteoporosis due to his Vitamin D deficiency. He was encouraged to take his Vitamin D and follow his higher calcium diet and increase strengthening exercise to help strengthen his bones and decrease his risk of osteopenia and osteoporosis. Fernie was given  (15 minutes) osteoporosis prevention counseling today.   Pre-Diabetes Partick will continue to work on weight loss, exercise, decreasing carbohydrates, increasing protein and healthy fats to help decrease the risk of diabetes.   Obesity Canton is currently in the action stage of change. As such, his goal is to continue with weight loss efforts. He has agreed to follow the Category 2 plan. Moishy will work on meal planning and intentional eating. Edison has been instructed to increase walking on the treadmill for weight loss and overall health benefits. We discussed the following Behavioral Modification Strategies today: increasing lean protein intake, decreasing simple carbohydrates, increasing vegetables, increase H20 intake, decrease eating out, no skipping meals, work on meal planning and easy cooking plans, keeping healthy foods in the home, and planning for success.  Christohper has agreed to follow-up with our clinic in 2-4 weeks. He was informed of the importance of frequent follow-up visits to maximize his success with intensive lifestyle modifications for his multiple health conditions.  ALLERGIES: No Known Allergies  MEDICATIONS: Current Outpatient Medications  on File Prior to Visit  Medication Sig Dispense Refill   ibuprofen (ADVIL,MOTRIN) 200 MG tablet Take 200 mg by mouth every 6 (six) hours as needed.     ranitidine (ZANTAC) 150 MG tablet Take 150 mg by mouth daily as needed for heartburn.     No current  facility-administered medications on file prior to visit.     PAST MEDICAL HISTORY: Past Medical History:  Diagnosis Date   Anxiety    Back pain    Cancer ([place])    skin cancer   Colon polyps 2006   hyperplastic polyps   GERD (gastroesophageal reflux disease)    History of hiatal hernia    umbilical and hiatal   Joint pain    Pneumonia    Viral as a child   Vitamin D deficiency     PAST SURGICAL HISTORY: Past Surgical History:  Procedure Laterality Date   HERNIA REPAIR  2008   x2 last in 2015   [place] N/A 01/15/2017   Procedure: INSERTION OF MESH;  Surgeon: Ralene Ok, MD;  Location: [facility];  Service: General;  Laterality: N/A;   [place] N/A 01/15/2017   Procedure: LAPRASCOPIC ASSISTED OPEN RECURRENT VENTRAL HERNIA REPAIR WITH MESH;  Surgeon: Ralene Ok, MD;  Location: [facility];  Service: General;  Laterality: N/A;    SOCIAL HISTORY: Social History   Tobacco Use   Smoking status: Current Every Day Smoker    Packs/day: 1.00    Types: Cigarettes   Smokeless tobacco: Never Used  Substance Use Topics   Alcohol use: Yes    Comment: no daily but heavy (1 or 2 cases during the weekend)   Drug use: No    FAMILY HISTORY: Family History  Problem Relation Age of Onset   Colon cancer Mother    Diabetes Mother    High blood pressure Mother    Stroke Mother    Depression Mother    Anxiety disorder Mother    Obesity Mother    Esophageal cancer Father    Prostate cancer Neg Hx    Coronary artery disease Neg Hx    ROS: Review of Systems  Gastrointestinal: Negative for nausea and vomiting.  Musculoskeletal:       Negative for muscle weakness.  Endo/Heme/Allergies:       Negative for hypoglycemia.   PHYSICAL EXAM: Pulse 81, temperature 98.3 F (36.8 C), height 5\' 9"  (1.753 m), weight 227 lb (103 kg), SpO2 97 %. Body mass index is 33.52 kg/m. Physical Exam Vitals signs reviewed.    Constitutional:      Appearance: Normal appearance. He is obese.  Cardiovascular:     Rate and Rhythm: Normal rate.     Pulses: Normal pulses.  Pulmonary:     Effort: Pulmonary effort is normal.     Breath sounds: Normal breath sounds.  Musculoskeletal: Normal range of motion.  Skin:    General: Skin is warm and dry.  Neurological:     Mental Status: He is alert and oriented to person, place, and time.  Psychiatric:        Behavior: Behavior normal.   RECENT LABS AND TESTS: BMET    Component Value Date/Time   NA 139 06/06/2019 1010   K 4.3 06/06/2019 1010   CL 101 06/06/2019 1010   CO2 22 06/06/2019 1010   GLUCOSE 82 06/06/2019 1010   GLUCOSE 88 09/16/2018 1603   BUN 14 06/06/2019 1010   CREATININE 0.80  06/06/2019 1010   CREATININE 0.87 02/10/2018 1453   CALCIUM 9.0 06/06/2019 1010   GFRNONAA 99 06/06/2019 1010   GFRAA 115 06/06/2019 1010   Lab Results  Component Value Date   HGBA1C 5.7 (H) 06/06/2019   Lab Results  Component Value Date   INSULIN 12.7 06/06/2019   CBC    Component Value Date/Time   WBC 8.0 09/16/2018 1603   RBC 5.09 09/16/2018 1603   HGB 15.4 09/16/2018 1603   HCT 45.7 09/16/2018 1603   PLT 196.0 09/16/2018 1603   MCV 89.8 09/16/2018 1603   MCH 30.0 01/12/2017 0830   MCHC 33.7 09/16/2018 1603   RDW 13.8 09/16/2018 1603   LYMPHSABS 1.6 05/22/2011 1054   MONOABS 0.6 05/22/2011 1054   EOSABS 0.4 05/22/2011 1054   BASOSABS 0.0 05/22/2011 1054   Iron/TIBC/Ferritin/ %Sat No results found for: IRON, TIBC, FERRITIN, IRONPCTSAT Lipid Panel     Component Value Date/Time   CHOL 179 06/06/2019 1010   TRIG 84 06/06/2019 1010   HDL 52 06/06/2019 1010   CHOLHDL 4 02/10/2018 1453   VLDL 29.0 02/10/2018 1453   LDLCALC 110 (H) 06/06/2019 1010   LDLDIRECT 139.0 09/16/2018 1603   Hepatic Function Panel     Component Value Date/Time   PROT 6.5 06/06/2019 1010   ALBUMIN 4.0 06/06/2019 1010   AST 18 06/06/2019 1010   ALT 21 06/06/2019 1010    ALKPHOS 58 06/06/2019 1010   BILITOT 0.3 06/06/2019 1010      Component Value Date/Time   TSH 1.18 09/16/2018 1603   TSH 1.96 05/22/2011 1054   Results for BURCH, BREITKREUTZ (MRN [id number]) as of 11/02/2019 16:54  Ref. Range 06/06/2019 10:10  Vitamin D, 25-Hydroxy Latest Ref Range: 30.0 - 100.0 ng/mL 44.3   OBESITY BEHAVIORAL INTERVENTION VISIT  Today's visit was #6  Starting weight: 265 lbs Starting date: 06/06/2019 Today's weight: 227 lbs  Today's date: 11/02/2019 Total lbs lost to date: 38     11/02/2019  Height 5\' 9"  (1.753 m)  Weight 227 lb (103 kg)  BMI (Calculated) 33.51   Body Fat % 31.4 %  Total Body Water (lbs) 108.2 lbs   ASK: We discussed the diagnosis of obesity with Leane Platt today and [name] agreed to give [place] permission to discuss obesity behavioral modification therapy today.  ASSESS: Kahmani has the diagnosis of obesity and his BMI today is 33.6. Waldron is in the action stage of change.   ADVISE: Grandon was educated on the multiple health risks of obesity as well as the benefit of weight loss to improve his health. He was advised of the need for long term treatment and the importance of lifestyle modifications to improve his current health and to decrease his risk of future health problems.  AGREE: Multiple dietary modification options and treatment options were discussed and  Hussan agreed to follow the recommendations documented in the above note.  ARRANGE: Pardeep was educated on the importance of frequent visits to treat obesity as outlined per CMS and USPSTF guidelines and agreed to schedule his next follow up appointment today.  Migdalia Dk, am acting as Location manager for [employer], DO  I have reviewed the above documentation for accuracy and completeness, and I agree with the above. -Jearld Lesch, DO

## 2019-12-05 ENCOUNTER — Encounter (INDEPENDENT_AMBULATORY_CARE_PROVIDER_SITE_OTHER): Payer: Self-pay | Admitting: Bariatrics

## 2019-12-05 ENCOUNTER — Other Ambulatory Visit: Payer: Self-pay

## 2019-12-05 ENCOUNTER — Telehealth (INDEPENDENT_AMBULATORY_CARE_PROVIDER_SITE_OTHER): Payer: BC Managed Care – PPO | Admitting: Bariatrics

## 2019-12-05 DIAGNOSIS — Z6834 Body mass index (BMI) 34.0-34.9, adult: Secondary | ICD-10-CM

## 2019-12-05 DIAGNOSIS — E559 Vitamin D deficiency, unspecified: Secondary | ICD-10-CM | POA: Diagnosis not present

## 2019-12-05 DIAGNOSIS — E669 Obesity, unspecified: Secondary | ICD-10-CM

## 2019-12-05 DIAGNOSIS — E66811 Obesity, class 1: Secondary | ICD-10-CM

## 2019-12-05 DIAGNOSIS — R7303 Prediabetes: Secondary | ICD-10-CM

## 2019-12-05 MED ORDER — VITAMIN D (ERGOCALCIFEROL) 1.25 MG (50000 UNIT) PO CAPS: 50000.0000 [IU] | ORAL_CAPSULE | ORAL | 0 refills | Status: DC

## 2019-12-07 NOTE — Progress Notes (Signed)
TeleHealth Visit:  Due to the COVID-19 pandemic, this visit was completed with telemedicine (audio/video) technology to reduce patient and provider exposure as well as to preserve personal protective equipment.   Steven Zimmerman has verbally consented to this TeleHealth visit. The patient is located at home, the provider is located at the [facility] office. The participants in this visit include the listed provider and patient. The visit was conducted today via Webex.   Chief Complaint: OBESITY Steven Zimmerman is here to discuss his progress with his obesity treatment plan along with follow-up of his obesity related diagnoses. Steven Zimmerman is on the Category 2 Plan and states he is following his eating plan approximately 65% of the time. Steven Zimmerman states he is exercising 0 minutes 0 times per week.  Today's visit was #: 7 Starting weight: 265 lbs Starting date: 06/06/2019  Interim History: Shyler states that he is up 9 lbs (weight 235). He states that his daughter has positive COVID results and is doing better.  Subjective:   Vitamin D deficiency. No nausea, vomiting, or muscle weakness.   Prediabetes. Steven Zimmerman has a diagnosis of prediabetes based on his elevated HgA1c and was informed this puts him at greater risk of developing diabetes. He continues to work on diet and exercise to decrease his risk of diabetes. He denies nausea or hypoglycemia. No polyphagia. Last A1c 5.7 on 06/06/2019 with an insulin of 12.7.  Assessment/Plan:   Vitamin D deficiency. Low Vitamin D level contributes to fatigue and are associated with obesity, breast, and colon cancer. He agrees to continue to take prescription Vitamin D, Ergocalciferol, (DRISDOL) 1.25 MG (50000 UNIT) CAPS capsule every week #4 with 0 refills and will follow-up for routine testing of vitamin D, at least 2-3 times per year to avoid over-replacement.  Prediabetes. Steven Zimmerman will continue to work on weight loss, exercise,  increasing protein and healthy fats, and decreasing simple carbohydrates to help decrease the risk of diabetes.   Class 1 obesity with serious comorbidity and body mass index (BMI) of 34.0 to 34.9 in adult, unspecified obesity type.   Cobra is currently in the action stage of change. As such, his goal is to continue with weight loss efforts. He has agreed to the Category 2 Plan.   He will work on meal planning, intentional eating, decrease carbohydrates, and will be more adherent to the plan.  We discussed the following exercise goals today: Steven Zimmerman will get on the treadmill and increase activity.  We discussed the following behavioral modification strategies today: increasing lean protein intake, decreasing simple carbohydrates, increasing vegetables, increasing water intake, decreasing eating out, no skipping meals, meal planning and cooking strategies and keeping healthy foods in the home.  Steven Zimmerman has agreed to follow-up with our clinic in 2 weeks. He was informed of the importance of frequent follow-up visits to maximize his success with intensive lifestyle modifications for his multiple health conditions.  Objective:   VITALS: Per patient if applicable, see vitals. GENERAL: Alert and in no acute distress. CARDIOPULMONARY: No increased WOB. Speaking in clear sentences.  PSYCH: Pleasant and cooperative. Speech normal rate and rhythm. Affect is appropriate. Insight and judgement are appropriate. Attention is focused, linear, and appropriate.  NEURO: Oriented as arrived to appointment on time with no prompting.   Lab Results  Component Value Date   CREATININE 0.80 06/06/2019   BUN 14 06/06/2019   NA 139 06/06/2019   K 4.3 06/06/2019   CL 101 06/06/2019  CO2 22 06/06/2019   Lab Results  Component Value Date   ALT 21 06/06/2019   AST 18 06/06/2019   ALKPHOS 58 06/06/2019   BILITOT 0.3 06/06/2019   Lab Results  Component Value Date   HGBA1C 5.7 (H) 06/06/2019    Lab Results  Component Value Date   INSULIN 12.7 06/06/2019   Lab Results  Component Value Date   TSH 1.18 09/16/2018   Lab Results  Component Value Date   CHOL 179 06/06/2019   HDL 52 06/06/2019   LDLCALC 110 (H) 06/06/2019   LDLDIRECT 139.0 09/16/2018   TRIG 84 06/06/2019   CHOLHDL 4 02/10/2018   Lab Results  Component Value Date   WBC 8.0 09/16/2018   HGB 15.4 09/16/2018   HCT 45.7 09/16/2018   MCV 89.8 09/16/2018   PLT 196.0 09/16/2018   No results found for: IRON, TIBC, FERRITIN  Attestation Statements:   Reviewed by clinician on day of visit: allergies, medications, problem list, medical history, surgical history, family history, social history, and previous encounter notes.  Migdalia Dk, am acting as Location manager for [employer], DO   I have reviewed the above documentation for accuracy and completeness, and I agree with the above. Jearld Lesch, DO

## 2019-12-20 ENCOUNTER — Encounter (INDEPENDENT_AMBULATORY_CARE_PROVIDER_SITE_OTHER): Payer: Self-pay

## 2019-12-22 ENCOUNTER — Encounter (INDEPENDENT_AMBULATORY_CARE_PROVIDER_SITE_OTHER): Payer: Self-pay | Admitting: Bariatrics

## 2019-12-22 ENCOUNTER — Other Ambulatory Visit: Payer: Self-pay

## 2019-12-22 ENCOUNTER — Ambulatory Visit (INDEPENDENT_AMBULATORY_CARE_PROVIDER_SITE_OTHER): Payer: BC Managed Care – PPO | Admitting: Bariatrics

## 2019-12-22 VITALS — BP 114/74 | HR 85 | Temp 98.1°F | Ht 69.0 in | Wt 230.0 lb

## 2019-12-22 DIAGNOSIS — R7303 Prediabetes: Secondary | ICD-10-CM

## 2019-12-22 DIAGNOSIS — Z6834 Body mass index (BMI) 34.0-34.9, adult: Secondary | ICD-10-CM | POA: Diagnosis not present

## 2019-12-22 DIAGNOSIS — E669 Obesity, unspecified: Secondary | ICD-10-CM

## 2019-12-22 DIAGNOSIS — E559 Vitamin D deficiency, unspecified: Secondary | ICD-10-CM | POA: Diagnosis not present

## 2019-12-22 NOTE — Progress Notes (Signed)
Chief Complaint:   OBESITY Steven Zimmerman is here to discuss his progress with his obesity treatment plan along with follow-up of his obesity related diagnoses. Steven Zimmerman is on the Category 2 Plan and states he is following his eating plan approximately 90% for 1 week. Steven Zimmerman states he is walking 30 minutes 3 times for 1 week.  Today's visit was #: 8 Starting weight: 265 lbs Starting date: 06/06/2019 Today's weight: 230 lbs Today's date: 12/22/2019 Total lbs lost to date: 35  Total lbs lost since last in-office visit: 0  Interim History: Steven Zimmerman is up 3 lbs. He has done well overall, but struggled over the last few weeks. He is cutting back on the creamer and decreasing his alcohol intake. He is sticking to the plan.  Subjective:   Prediabetes. Steven Zimmerman has a diagnosis of prediabetes based on his elevated HgA1c and was informed this puts him at greater risk of developing diabetes. He continues to work on diet and exercise to decrease his risk of diabetes. He denies nausea or hypoglycemia. He is on no medications.  Lab Results  Component Value Date   HGBA1C 5.7 (H) 06/06/2019   Lab Results  Component Value Date   INSULIN 12.7 06/06/2019   Vitamin D deficiency. No nausea, vomiting, or muscle weakness. Last Vitamin D 44.3 on 06/06/2019.  Assessment/Plan:   Prediabetes. Steven Zimmerman will continue to work on weight loss, exercise, increasing healthy fats and protein, and decreasing simple carbohydrates to help decrease the risk of diabetes.   Vitamin D deficiency. Low Vitamin D level contributes to fatigue and are associated with obesity, breast, and colon cancer. He agrees to continue to take Vitamin D and will follow-up for routine testing of Vitamin D, at least 2-3 times per year to avoid over-replacement.  Class 1 obesity with serious comorbidity and body mass index (BMI) of 34.0 to 34.9 in adult, unspecified obesity type.  Steven Zimmerman is currently in the action stage of change. As  such, his goal is to continue with weight loss efforts. He has agreed to the Category 2 Plan.   He will work on meal planning, intentional eating, and increasing his water intake.  Exercise goals: Steven Zimmerman will continue walking on the treadmill.  Behavioral modification strategies: increasing lean protein intake, decreasing simple carbohydrates, increasing vegetables, increasing water intake, decreasing eating out, no skipping meals, meal planning and cooking strategies and keeping healthy foods in the home.  Steven Zimmerman has agreed to follow-up with our clinic in 2 weeks. He was informed of the importance of frequent follow-up visits to maximize his success with intensive lifestyle modifications for his multiple health conditions.   Objective:   Blood pressure 114/74, pulse 85, temperature 98.1 F (36.7 C), temperature source Oral, height 5\' 9"  (1.753 m), weight 230 lb (104.3 kg), SpO2 98 %. Body mass index is 33.97 kg/m.  General: Cooperative, alert, well developed, in no acute distress. HEENT: Conjunctivae and lids unremarkable. Cardiovascular: Regular rhythm.  Lungs: Normal work of breathing. Neurologic: No focal deficits.   Lab Results  Component Value Date   CREATININE 0.80 06/06/2019   BUN 14 06/06/2019   NA 139 06/06/2019   K 4.3 06/06/2019   CL 101 06/06/2019   CO2 22 06/06/2019   Lab Results  Component Value Date   ALT 21 06/06/2019   AST 18 06/06/2019   ALKPHOS 58 06/06/2019   BILITOT 0.3 06/06/2019   Lab Results  Component Value Date   HGBA1C 5.7 (H) 06/06/2019   Lab  Results  Component Value Date   INSULIN 12.7 06/06/2019   Lab Results  Component Value Date   TSH 1.18 09/16/2018   Lab Results  Component Value Date   CHOL 179 06/06/2019   HDL 52 06/06/2019   LDLCALC 110 (H) 06/06/2019   LDLDIRECT 139.0 09/16/2018   TRIG 84 06/06/2019   CHOLHDL 4 02/10/2018   Lab Results  Component Value Date   WBC 8.0 09/16/2018   HGB 15.4 09/16/2018   HCT 45.7  09/16/2018   MCV 89.8 09/16/2018   PLT 196.0 09/16/2018   No results found for: IRON, TIBC, FERRITIN  Attestation Statements:   Reviewed by clinician on day of visit: allergies, medications, problem list, medical history, surgical history, family history, social history, and previous encounter notes.  Time spent on visit including pre-visit chart review and post-visit care was 20 minutes.   Migdalia Dk, am acting as Location manager for [employer], DO   I have reviewed the above documentation for accuracy and completeness, and I agree with the above. Jearld Lesch, DO

## 2020-01-05 ENCOUNTER — Encounter (INDEPENDENT_AMBULATORY_CARE_PROVIDER_SITE_OTHER): Payer: Self-pay | Admitting: Physician Assistant

## 2020-01-05 ENCOUNTER — Ambulatory Visit (INDEPENDENT_AMBULATORY_CARE_PROVIDER_SITE_OTHER): Payer: BC Managed Care – PPO | Admitting: Physician Assistant

## 2020-01-05 ENCOUNTER — Other Ambulatory Visit: Payer: Self-pay

## 2020-01-05 VITALS — BP 112/80 | HR 92 | Temp 98.2°F | Ht 69.0 in | Wt 227.0 lb

## 2020-01-05 DIAGNOSIS — E669 Obesity, unspecified: Secondary | ICD-10-CM

## 2020-01-05 DIAGNOSIS — R7303 Prediabetes: Secondary | ICD-10-CM

## 2020-01-05 DIAGNOSIS — Z6833 Body mass index (BMI) 33.0-33.9, adult: Secondary | ICD-10-CM

## 2020-01-05 DIAGNOSIS — Z9189 Other specified personal risk factors, not elsewhere classified: Secondary | ICD-10-CM

## 2020-01-05 DIAGNOSIS — E559 Vitamin D deficiency, unspecified: Secondary | ICD-10-CM

## 2020-01-05 MED ORDER — VITAMIN D (ERGOCALCIFEROL) 1.25 MG (50000 UNIT) PO CAPS: 50000.0000 [IU] | ORAL_CAPSULE | ORAL | 0 refills | Status: DC

## 2020-01-09 NOTE — Progress Notes (Signed)
Chief Complaint:   OBESITY Steven Zimmerman is here to discuss his progress with his obesity treatment plan along with follow-up of his obesity related diagnoses. Steven Zimmerman is on the Category 4 Plan and states he is following his eating plan approximately 85-90% of the time. Steven Zimmerman states he is exercising on the treadmill 30 minutes 5 times per week.  Today's visit was #: 9 Starting weight: 265 lbs Starting date: 06/06/2019 Today's weight: 227 lbs Today's date: 01/05/2020 Total lbs lost to date: 38 Total lbs lost since last in-office visit: 3  Interim History: Steven Zimmerman reports getting back on track over the last few weeks. He states that he has no struggles with the plan.   Subjective:   Vitamin D deficiency. Steven Zimmerman is on prescription Vitamin D. No nausea, vomiting, or muscle weakness. Last Vitamin D level was not at goal (44.3 on 06/06/2019).   Prediabetes. Steven Zimmerman has a diagnosis of prediabetes based on his elevated HgA1c and was informed this puts him at greater risk of developing diabetes. He continues to work on diet and exercise to decrease his risk of diabetes. He denies nausea or hypoglycemia. Steven Zimmerman is on no medications and is exercising regularly.  Lab Results  Component Value Date   HGBA1C 5.7 (H) 06/06/2019   Lab Results  Component Value Date   INSULIN 12.7 06/06/2019   At risk for osteoporosis. Steven Zimmerman is at higher risk of osteopenia and osteoporosis due to Vitamin D deficiency.   Assessment/Plan:   Vitamin D deficiency. Low Vitamin D level contributes to fatigue and are associated with obesity, breast, and colon cancer. He was given a refill on his Vitamin D, Ergocalciferol, (DRISDOL) 1.25 MG (50000 UNIT) CAPS capsule every week #4 with 0 refills and will follow-up for routine testing of Vitamin D, at least 2-3 times per year to avoid over-replacement.       Prediabetes. Steven Zimmerman will continue to work on weight loss, exercise, and decreasing simple carbohydrates  to help decrease the risk of diabetes.   At risk for osteoporosis. Steven Zimmerman was given approximately 15 minutes of osteoporosis prevention counseling today. Steven Zimmerman is at risk for osteopenia and osteoporosis due to his Vitamin D deficiency. He was encouraged to take his Vitamin D and follow his higher calcium diet and increase strengthening exercise to help strengthen his bones and decrease his risk of osteopenia and osteoporosis.  Repetitive spaced learning was employed today to elicit superior memory formation and behavioral change.  Class 1 obesity with serious comorbidity and body mass index (BMI) of 33.0 to 33.9 in adult, unspecified obesity type.  Steven Zimmerman is currently in the action stage of change. As such, his goal is to continue with weight loss efforts. He has agreed to the Category 4 Plan.   Exercise goals: For substantial health benefits, adults should do at least 150 minutes (2 hours and 30 minutes) a week of moderate-intensity, or 75 minutes (1 hour and 15 minutes) a week of vigorous-intensity aerobic physical activity, or an equivalent combination of moderate- and vigorous-intensity aerobic activity. Aerobic activity should be performed in episodes of at least 10 minutes, and preferably, it should be spread throughout the week.  Behavioral modification strategies: meal planning and cooking strategies and keeping healthy foods in the home.  Steven Zimmerman has agreed to follow-up with our clinic in 3 weeks. He was informed of the importance of frequent follow-up visits to maximize his success with intensive lifestyle modifications for his multiple health conditions.   Objective:   Blood  pressure 112/80, pulse 92, temperature 98.2 F (36.8 C), temperature source Oral, height 5\' 9"  (1.753 m), weight 227 lb (103 kg), SpO2 95 %. Body mass index is 33.52 kg/m.  General: Cooperative, alert, well developed, in no acute distress. HEENT: Conjunctivae and lids unremarkable. Cardiovascular: Regular  rhythm.  Lungs: Normal work of breathing. Neurologic: No focal deficits.   Lab Results  Component Value Date   CREATININE 0.80 06/06/2019   BUN 14 06/06/2019   NA 139 06/06/2019   K 4.3 06/06/2019   CL 101 06/06/2019   CO2 22 06/06/2019   Lab Results  Component Value Date   ALT 21 06/06/2019   AST 18 06/06/2019   ALKPHOS 58 06/06/2019   BILITOT 0.3 06/06/2019   Lab Results  Component Value Date   HGBA1C 5.7 (H) 06/06/2019   Lab Results  Component Value Date   INSULIN 12.7 06/06/2019   Lab Results  Component Value Date   TSH 1.18 09/16/2018   Lab Results  Component Value Date   CHOL 179 06/06/2019   HDL 52 06/06/2019   LDLCALC 110 (H) 06/06/2019   LDLDIRECT 139.0 09/16/2018   TRIG 84 06/06/2019   CHOLHDL 4 02/10/2018   Lab Results  Component Value Date   WBC 8.0 09/16/2018   HGB 15.4 09/16/2018   HCT 45.7 09/16/2018   MCV 89.8 09/16/2018   PLT 196.0 09/16/2018   No results found for: IRON, TIBC, FERRITIN  Attestation Statements:   Reviewed by clinician on day of visit: allergies, medications, problem list, medical history, surgical history, family history, social history, and previous encounter notes.  Steven Zimmerman, am acting as transcriptionist for Steven Potash, PA-C   I have reviewed the above documentation for accuracy and completeness, and I agree with the above. Steven Potash, PA-C

## 2020-01-26 ENCOUNTER — Other Ambulatory Visit: Payer: Self-pay

## 2020-01-26 ENCOUNTER — Encounter (INDEPENDENT_AMBULATORY_CARE_PROVIDER_SITE_OTHER): Payer: Self-pay | Admitting: Physician Assistant

## 2020-01-26 ENCOUNTER — Ambulatory Visit (INDEPENDENT_AMBULATORY_CARE_PROVIDER_SITE_OTHER): Payer: BC Managed Care – PPO | Admitting: Physician Assistant

## 2020-01-26 VITALS — BP 130/74 | HR 80 | Temp 98.0°F | Ht 69.0 in | Wt 221.0 lb

## 2020-01-26 DIAGNOSIS — Z9189 Other specified personal risk factors, not elsewhere classified: Secondary | ICD-10-CM | POA: Diagnosis not present

## 2020-01-26 DIAGNOSIS — R7303 Prediabetes: Secondary | ICD-10-CM

## 2020-01-26 DIAGNOSIS — E7849 Other hyperlipidemia: Secondary | ICD-10-CM

## 2020-01-26 DIAGNOSIS — E669 Obesity, unspecified: Secondary | ICD-10-CM

## 2020-01-26 DIAGNOSIS — Z6832 Body mass index (BMI) 32.0-32.9, adult: Secondary | ICD-10-CM

## 2020-01-26 DIAGNOSIS — E559 Vitamin D deficiency, unspecified: Secondary | ICD-10-CM

## 2020-01-27 LAB — COMPREHENSIVE METABOLIC PANEL
ALT: 18 IU/L (ref 0–44)
AST: 20 IU/L (ref 0–40)
Albumin/Globulin Ratio: 1.8 (ref 1.2–2.2)
Albumin: 4.3 g/dL (ref 3.8–4.9)
Alkaline Phosphatase: 58 IU/L (ref 39–117)
BUN/Creatinine Ratio: 23 — ABNORMAL HIGH (ref 9–20)
BUN: 22 mg/dL (ref 6–24)
Bilirubin Total: 0.6 mg/dL (ref 0.0–1.2)
CO2: 20 mmol/L (ref 20–29)
Calcium: 9.1 mg/dL (ref 8.7–10.2)
Chloride: 101 mmol/L (ref 96–106)
Creatinine, Ser: 0.97 mg/dL (ref 0.76–1.27)
GFR calc Af Amer: 100 mL/min/{1.73_m2} (ref >59)
GFR calc non Af Amer: 86 mL/min/{1.73_m2} (ref >59)
Globulin, Total: 2.4 g/dL (ref 1.5–4.5)
Glucose: 80 mg/dL (ref 65–99)
Potassium: 4.4 mmol/L (ref 3.5–5.2)
Sodium: 138 mmol/L (ref 134–144)
Total Protein: 6.7 g/dL (ref 6.0–8.5)

## 2020-01-27 LAB — LIPID PANEL WITH LDL/HDL RATIO
Cholesterol, Total: 164 mg/dL (ref 100–199)
HDL: 66 mg/dL (ref >39)
LDL Chol Calc (NIH): 89 mg/dL (ref 0–99)
LDL/HDL Ratio: 1.3 ratio (ref 0.0–3.6)
Triglycerides: 44 mg/dL (ref 0–149)
VLDL Cholesterol Cal: 9 mg/dL (ref 5–40)

## 2020-01-27 LAB — HEMOGLOBIN A1C
Est. average glucose Bld gHb Est-mCnc: 105 mg/dL
Hgb A1c MFr Bld: 5.3 % (ref 4.8–5.6)

## 2020-01-27 LAB — INSULIN, RANDOM: INSULIN: 7.1 u[IU]/mL (ref 2.6–24.9)

## 2020-01-27 LAB — VITAMIN D 25 HYDROXY (VIT D DEFICIENCY, FRACTURES): Vit D, 25-Hydroxy: 74.4 ng/mL (ref 30.0–100.0)

## 2020-01-30 NOTE — Progress Notes (Signed)
Chief Complaint:   OBESITY Steven Zimmerman is here to discuss his progress with his obesity treatment plan along with follow-up of his obesity related diagnoses. [name] is on the Category 4 Plan and states he is following his eating plan approximately 85-90% of the time. [name] states he is walking on the treadmill 30 minutes 5 times per week.  Today's visit was #: 10 Starting weight: 265 lbs Starting date: 06/06/2019 Today's weight: 221 lbs Today's date: 01/26/2020 Total lbs lost to date: 44 Total lbs lost since last in-office visit: 6  Interim History: [name] reports that his hunger is controlled on most days. He is exercising regularly.   Subjective:   Prediabetes. Steven Zimmerman has a diagnosis of prediabetes based on his elevated HgA1c and was informed this puts him at greater risk of developing diabetes. He continues to work on diet and exercise to decrease his risk of diabetes. He denies nausea or hypoglycemia. [name] is on no medications. No polyphagia.  He is exercising regularly. Due for labs.  Lab Results  Component Value Date   HGBA1C 5.3 01/26/2020   Lab Results  Component Value Date   INSULIN 7.1 01/26/2020   INSULIN 12.7 06/06/2019   Vitamin D deficiency. Last Vitamin D 44.3 on 06/06/2019. [name] is on weekly Vitamin D. No nausea, vomiting, or muscle weakness. Is due for labs.  Other hyperlipidemia. Steven Zimmerman is on no medications. No chest pain. Is exercising regularly. Due for labs.  Lab Results  Component Value Date   CHOL 164 01/26/2020   HDL 66 01/26/2020   LDLCALC 89 01/26/2020   LDLDIRECT 139.0 09/16/2018   TRIG 44 01/26/2020   CHOLHDL 4 02/10/2018   Lab Results  Component Value Date   ALT 18 01/26/2020   AST 20 01/26/2020   ALKPHOS 58 01/26/2020   BILITOT 0.6 01/26/2020   The 10-year ASCVD risk score [name], et al., 2013) is: 7.8%   Values used to calculate the score:     Age: 59 years     Sex: Male     Is Non-Hispanic African  American: No     Diabetic: No     Tobacco smoker: Yes     Systolic Blood Pressure: [id number]     Is BP treated: No     HDL Cholesterol: 66 mg/dL     Total Cholesterol: 164 mg/dL  At risk for heart disease. Woodward is at a higher than average risk for cardiovascular disease due to obesity. Reviewed: no chest pain on exertion, no dyspnea on exertion, and no swelling of ankles.  Assessment/Plan:   Prediabetes. Elmus will continue to work on weight loss, exercise, and decreasing simple carbohydrates to help decrease the risk of diabetes. Comprehensive metabolic panel, Hemoglobin A1c, Insulin, random labs ordered.  Vitamin D deficiency. Low Vitamin D level contributes to fatigue and are associated with obesity, breast, and colon cancer. VITAMIN D 25 Hydroxy (Vit-D Deficiency, Fractures) level ordered today.  Other hyperlipidemia. Cardiovascular risk and specific lipid/LDL goals reviewed.  We discussed several lifestyle modifications today and Steven Zimmerman will continue to work on diet, exercise and weight loss efforts. Orders and follow up as documented in patient record. Lipid Panel With LDL/HDL Ratio labs ordered.  Counseling Intensive lifestyle modifications are the first line treatment for this issue.  Dietary changes: Increase soluble fiber. Decrease simple carbohydrates.  Exercise changes: Moderate to vigorous-intensity aerobic activity 150 minutes per week if tolerated.  Lipid-lowering medications: see documented in medical record.  At risk for heart disease. Delayne was given approximately 15 minutes of coronary artery disease prevention counseling today. He is 59 y.o. male and has risk factors for heart disease including obesity. We discussed intensive lifestyle modifications today with an emphasis on specific weight loss instructions and strategies.   Repetitive spaced learning was employed today to elicit superior memory formation and behavioral change.  Class 1 obesity with serious  comorbidity and body mass index (BMI) of 32.0 to 32.9 in adult, unspecified obesity type.  Steven Zimmerman is currently in the action stage of change. As such, his goal is to continue with weight loss efforts. He has agreed to the Category 4 Plan.   Exercise goals: For substantial health benefits, adults should do at least 150 minutes (2 hours and 30 minutes) a week of moderate-intensity, or 75 minutes (1 hour and 15 minutes) a week of vigorous-intensity aerobic physical activity, or an equivalent combination of moderate- and vigorous-intensity aerobic activity. Aerobic activity should be performed in episodes of at least 10 minutes, and preferably, it should be spread throughout the week.  Behavioral modification strategies: meal planning and cooking strategies and keeping healthy foods in the home.  Steven Zimmerman has agreed to follow-up with our clinic in 4 weeks. He was informed of the importance of frequent follow-up visits to maximize his success with intensive lifestyle modifications for his multiple health conditions.   Steven Zimmerman was informed we would discuss his lab results at his next visit unless there is a critical issue that needs to be addressed sooner. Steven Zimmerman agreed to keep his next visit at the agreed upon time to discuss these results.  Objective:   Blood pressure 130/74, pulse 80, temperature 98 F (36.7 C), temperature source Oral, height 5\' 9"  (1.753 m), weight 221 lb (100.2 kg), SpO2 97 %. Body mass index is 32.64 kg/m.  General: Cooperative, alert, well developed, in no acute distress. HEENT: Conjunctivae and lids unremarkable. Cardiovascular: Regular rhythm.  Lungs: Normal work of breathing. Neurologic: No focal deficits.   Lab Results  Component Value Date   CREATININE 0.97 01/26/2020   BUN 22 01/26/2020   NA 138 01/26/2020   K 4.4 01/26/2020   CL 101 01/26/2020   CO2 20 01/26/2020   Lab Results  Component Value Date   ALT 18 01/26/2020   AST 20 01/26/2020   ALKPHOS 58  01/26/2020   BILITOT 0.6 01/26/2020   Lab Results  Component Value Date   HGBA1C 5.3 01/26/2020   HGBA1C 5.7 (H) 06/06/2019   Lab Results  Component Value Date   INSULIN 7.1 01/26/2020   INSULIN 12.7 06/06/2019   Lab Results  Component Value Date   TSH 1.18 09/16/2018   Lab Results  Component Value Date   CHOL 164 01/26/2020   HDL 66 01/26/2020   LDLCALC 89 01/26/2020   LDLDIRECT 139.0 09/16/2018   TRIG 44 01/26/2020   CHOLHDL 4 02/10/2018   Lab Results  Component Value Date   WBC 8.0 09/16/2018   HGB 15.4 09/16/2018   HCT 45.7 09/16/2018   MCV 89.8 09/16/2018   PLT 196.0 09/16/2018   No results found for: IRON, TIBC, FERRITIN  Attestation Statements:   Reviewed by clinician on day of visit: allergies, medications, problem list, medical history, surgical history, family history, social history, and previous encounter notes.  IMichaelene Song, am acting as transcriptionist for Abby Potash, PA-C   I have reviewed the above documentation for accuracy and completeness, and I agree with the above. -  Abby Potash, PA-C

## 2020-02-15 ENCOUNTER — Ambulatory Visit (INDEPENDENT_AMBULATORY_CARE_PROVIDER_SITE_OTHER): Payer: BC Managed Care – PPO | Admitting: Physician Assistant

## 2020-02-15 ENCOUNTER — Other Ambulatory Visit: Payer: Self-pay

## 2020-02-15 ENCOUNTER — Encounter (INDEPENDENT_AMBULATORY_CARE_PROVIDER_SITE_OTHER): Payer: Self-pay | Admitting: Physician Assistant

## 2020-02-15 VITALS — BP 99/67 | HR 89 | Temp 98.3°F | Ht 69.0 in | Wt 218.0 lb

## 2020-02-15 DIAGNOSIS — E7849 Other hyperlipidemia: Secondary | ICD-10-CM

## 2020-02-15 DIAGNOSIS — E669 Obesity, unspecified: Secondary | ICD-10-CM

## 2020-02-15 DIAGNOSIS — E559 Vitamin D deficiency, unspecified: Secondary | ICD-10-CM

## 2020-02-15 DIAGNOSIS — Z6832 Body mass index (BMI) 32.0-32.9, adult: Secondary | ICD-10-CM | POA: Diagnosis not present

## 2020-02-16 NOTE — Progress Notes (Signed)
Chief Complaint:   OBESITY Steven Zimmerman is here to discuss his progress with his obesity treatment plan along with follow-up of his obesity related diagnoses. Steven Zimmerman is on the Category 4 Plan and states he is following his eating plan approximately 80-90% of the time. Steven Zimmerman states he is walking on the treadmill 30 minutes 5 times per week.  Today's visit was #: 11 Starting weight: 265 lbs Starting date: 06/06/2019 Today's weight: 218 lbs Today's date: 02/15/2020 Total lbs lost to date: 47 Total lbs lost since last in-office visit: 3  Interim History: Steven Zimmerman reports that he follows the plan for breakfast and lunch, but sometimes eats out for dinner. He is exercising regularly.  Subjective:   Vitamin D deficiency. Steven Zimmerman is on Vitamin D weekly. Last Vitamin D 74.4 on 01/26/2020.  Other hyperlipidemia. Steven Zimmerman is on no medications. Lipid panel is now within normal range. He is exercising regularly.   Lab Results  Component Value Date   CHOL 164 01/26/2020   HDL 66 01/26/2020   LDLCALC 89 01/26/2020   LDLDIRECT 139.0 09/16/2018   TRIG 44 01/26/2020   CHOLHDL 4 02/10/2018   Lab Results  Component Value Date   ALT 18 01/26/2020   AST 20 01/26/2020   ALKPHOS 58 01/26/2020   BILITOT 0.6 01/26/2020   The 10-year ASCVD risk score Steven Zimmerman DC Jr., et al., 2013) is: 4.9%   Values used to calculate the score:     Age: 59 years     Sex: Male     Is Non-Hispanic African American: No     Diabetic: No     Tobacco smoker: Yes     Systolic Blood Pressure: 99 mmHg     Is BP treated: No     HDL Cholesterol: 66 mg/dL     Total Cholesterol: 164 mg/dL  Assessment/Plan:   Vitamin D deficiency. Low Vitamin D level contributes to fatigue and are associated with obesity, breast, and colon cancer. He will change to 50,000 units every other week (no prescription needed) and will follow-up for routine testing of Vitamin D, at least 2-3 times per year to avoid  over-replacement.  Other hyperlipidemia. Cardiovascular risk and specific lipid/LDL goals reviewed.  We discussed several lifestyle modifications today and Steven Zimmerman will continue to work on diet, exercise and weight loss efforts. Orders and follow up as documented in patient record.   Counseling Intensive lifestyle modifications are the first line treatment for this issue. . Dietary changes: Increase soluble fiber. Decrease simple carbohydrates. . Exercise changes: Moderate to vigorous-intensity aerobic activity 150 minutes per week if tolerated. . Lipid-lowering medications: see documented in medical record.  Class 1 obesity with serious comorbidity and body mass index (BMI) of 32.0 to 32.9 in adult, unspecified obesity type.  Steven Zimmerman is currently in the action stage of change. As such, his goal is to continue with weight loss efforts. He has agreed to the Category 4 Plan.   Exercise goals: For substantial health benefits, adults should do at least 150 minutes (2 hours and 30 minutes) a week of moderate-intensity, or 75 minutes (1 hour and 15 minutes) a week of vigorous-intensity aerobic physical activity, or an equivalent combination of moderate- and vigorous-intensity aerobic activity. Aerobic activity should be performed in episodes of at least 10 minutes, and preferably, it should be spread throughout the week.  Behavioral modification strategies: decreasing eating out and meal planning and cooking strategies.  Steven Zimmerman has agreed to follow-up with our clinic in 3-4 weeks.  He was informed of the importance of frequent follow-up visits to maximize his success with intensive lifestyle modifications for his multiple health conditions.   Objective:   Blood pressure 99/67, pulse 89, temperature 98.3 F (36.8 C), temperature source Oral, height 5\' 9"  (1.753 m), weight 218 lb (98.9 kg), SpO2 97 %. Body mass index is 32.19 kg/m.  General: Cooperative, alert, well developed, in no acute  distress. HEENT: Conjunctivae and lids unremarkable. Cardiovascular: Regular rhythm.  Lungs: Normal work of breathing. Neurologic: No focal deficits.   Lab Results  Component Value Date   CREATININE 0.97 01/26/2020   BUN 22 01/26/2020   NA 138 01/26/2020   K 4.4 01/26/2020   CL 101 01/26/2020   CO2 20 01/26/2020   Lab Results  Component Value Date   ALT 18 01/26/2020   AST 20 01/26/2020   ALKPHOS 58 01/26/2020   BILITOT 0.6 01/26/2020   Lab Results  Component Value Date   HGBA1C 5.3 01/26/2020   HGBA1C 5.7 (H) 06/06/2019   Lab Results  Component Value Date   INSULIN 7.1 01/26/2020   INSULIN 12.7 06/06/2019   Lab Results  Component Value Date   TSH 1.18 09/16/2018   Lab Results  Component Value Date   CHOL 164 01/26/2020   HDL 66 01/26/2020   LDLCALC 89 01/26/2020   LDLDIRECT 139.0 09/16/2018   TRIG 44 01/26/2020   CHOLHDL 4 02/10/2018   Lab Results  Component Value Date   WBC 8.0 09/16/2018   HGB 15.4 09/16/2018   HCT 45.7 09/16/2018   MCV 89.8 09/16/2018   PLT 196.0 09/16/2018   No results found for: IRON, TIBC, FERRITIN  Attestation Statements:   Reviewed by clinician on day of visit: allergies, medications, problem list, medical history, surgical history, family history, social history, and previous encounter notes.  Time spent on visit including pre-visit chart review and post-visit charting and care was 30 minutes.   IMichaelene Song, am acting as transcriptionist for Abby Potash, PA-C   I have reviewed the above documentation for accuracy and completeness, and I agree with the above. Abby Potash, PA-C

## 2020-03-14 ENCOUNTER — Ambulatory Visit (INDEPENDENT_AMBULATORY_CARE_PROVIDER_SITE_OTHER): Payer: BC Managed Care – PPO | Admitting: Physician Assistant

## 2020-03-14 ENCOUNTER — Encounter (INDEPENDENT_AMBULATORY_CARE_PROVIDER_SITE_OTHER): Payer: Self-pay | Admitting: Physician Assistant

## 2020-03-14 ENCOUNTER — Other Ambulatory Visit: Payer: Self-pay

## 2020-03-14 VITALS — BP 109/68 | HR 73 | Temp 98.0°F | Ht 69.0 in | Wt 216.0 lb

## 2020-03-14 DIAGNOSIS — Z6831 Body mass index (BMI) 31.0-31.9, adult: Secondary | ICD-10-CM | POA: Diagnosis not present

## 2020-03-14 DIAGNOSIS — E7849 Other hyperlipidemia: Secondary | ICD-10-CM

## 2020-03-14 DIAGNOSIS — E669 Obesity, unspecified: Secondary | ICD-10-CM

## 2020-03-15 NOTE — Progress Notes (Signed)
Chief Complaint:   OBESITY Steven Zimmerman is here to discuss his progress with his obesity treatment plan along with follow-up of his obesity related diagnoses. [name] is on the Category 4 Plan and states he is following his eating plan approximately 80% of the time. [name] states he is exercising 0 minutes 0 times per week.  Today's visit was #: 12 Starting weight: 265 lbs Starting date: 06/06/2019 Today's weight: 216 lbs Today's date: 03/15/2020 Total lbs lost to date: 49 Total lbs lost since last in-office visit: 2  Interim History: Steven Zimmerman reports he has had no issues with the plan. Right now he is working 4 a.m. to 12 p.m.  Subjective:   Other hyperlipidemia. [name] is on no medication. No chest pain.   Lab Results  Component Value Date   CHOL 164 01/26/2020   HDL 66 01/26/2020   LDLCALC 89 01/26/2020   LDLDIRECT 139.0 09/16/2018   TRIG 44 01/26/2020   CHOLHDL 4 02/10/2018   Lab Results  Component Value Date   ALT 18 01/26/2020   AST 20 01/26/2020   ALKPHOS 58 01/26/2020   BILITOT 0.6 01/26/2020   The 10-year ASCVD risk score Steven Bussing DC Jr., et al., 2013) is: 5.8%   Values used to calculate the score:     Age: 59 years     Sex: Male     Is Non-Hispanic African American: No     Diabetic: No     Tobacco smoker: Yes     Systolic Blood Pressure: 0000000 mmHg     Is BP treated: No     HDL Cholesterol: 66 mg/dL     Total Cholesterol: 164 mg/dL  Assessment/Plan:   Other hyperlipidemia. Cardiovascular risk and specific lipid/LDL goals reviewed.  We discussed several lifestyle modifications today and Pascal will continue to work on diet, exercise and weight loss efforts. Orders and follow up as documented in patient record.   Counseling Intensive lifestyle modifications are the first line treatment for this issue. . Dietary changes: Increase soluble fiber. Decrease simple carbohydrates. . Exercise changes: Moderate to vigorous-intensity aerobic activity 150  minutes per week if tolerated. . Lipid-lowering medications: see documented in medical record.  Class 1 obesity with serious comorbidity and body mass index (BMI) of 31.0 to 31.9 in adult, unspecified obesity type.  Steven Zimmerman is currently in the action stage of change. As such, his goal is to continue with weight loss efforts. He has agreed to the Category 4 Plan.   Exercise goals: For substantial health benefits, adults should do at least 150 minutes (2 hours and 30 minutes) a week of moderate-intensity, or 75 minutes (1 hour and 15 minutes) a week of vigorous-intensity aerobic physical activity, or an equivalent combination of moderate- and vigorous-intensity aerobic activity. Aerobic activity should be performed in episodes of at least 10 minutes, and preferably, it should be spread throughout the week.  Behavioral modification strategies: meal planning and cooking strategies and better snacking choices.  Steven Zimmerman has agreed to follow-up with our clinic in 4 weeks. He was informed of the importance of frequent follow-up visits to maximize his success with intensive lifestyle modifications for his multiple health conditions.   Objective:   Blood pressure 109/68, pulse 73, temperature 98 F (36.7 C), temperature source Oral, height 5\' 9"  (1.753 m), weight 216 lb (98 kg), SpO2 97 %. Body mass index is 31.9 kg/m.  General: Cooperative, alert, well developed, in no acute distress. HEENT: Conjunctivae and lids unremarkable. Cardiovascular: Regular rhythm.  Lungs: Normal work of breathing. Neurologic: No focal deficits.   Lab Results  Component Value Date   CREATININE 0.97 01/26/2020   BUN 22 01/26/2020   NA 138 01/26/2020   K 4.4 01/26/2020   CL 101 01/26/2020   CO2 20 01/26/2020   Lab Results  Component Value Date   ALT 18 01/26/2020   AST 20 01/26/2020   ALKPHOS 58 01/26/2020   BILITOT 0.6 01/26/2020   Lab Results  Component Value Date   HGBA1C 5.3 01/26/2020   HGBA1C 5.7 (H)  06/06/2019   Lab Results  Component Value Date   INSULIN 7.1 01/26/2020   INSULIN 12.7 06/06/2019   Lab Results  Component Value Date   TSH 1.18 09/16/2018   Lab Results  Component Value Date   CHOL 164 01/26/2020   HDL 66 01/26/2020   LDLCALC 89 01/26/2020   LDLDIRECT 139.0 09/16/2018   TRIG 44 01/26/2020   CHOLHDL 4 02/10/2018   Lab Results  Component Value Date   WBC 8.0 09/16/2018   HGB 15.4 09/16/2018   HCT 45.7 09/16/2018   MCV 89.8 09/16/2018   PLT 196.0 09/16/2018   No results found for: IRON, TIBC, FERRITIN  Attestation Statements:   Reviewed by clinician on day of visit: allergies, medications, problem list, medical history, surgical history, family history, social history, and previous encounter notes.  Time spent on visit including pre-visit chart review and post-visit charting and care was 22 minutes.   IMichaelene Zimmerman, am acting as transcriptionist for Abby Potash, PA-C   I have reviewed the above documentation for accuracy and completeness, and I agree with the above. Abby Potash, PA-C

## 2020-04-11 ENCOUNTER — Other Ambulatory Visit: Payer: Self-pay

## 2020-04-11 ENCOUNTER — Ambulatory Visit (INDEPENDENT_AMBULATORY_CARE_PROVIDER_SITE_OTHER): Payer: BC Managed Care – PPO | Admitting: Physician Assistant

## 2020-04-11 ENCOUNTER — Encounter (INDEPENDENT_AMBULATORY_CARE_PROVIDER_SITE_OTHER): Payer: Self-pay | Admitting: Physician Assistant

## 2020-04-11 VITALS — BP 122/68 | HR 77 | Temp 98.2°F | Ht 69.0 in | Wt 216.0 lb

## 2020-04-11 DIAGNOSIS — E669 Obesity, unspecified: Secondary | ICD-10-CM | POA: Diagnosis not present

## 2020-04-11 DIAGNOSIS — Z6831 Body mass index (BMI) 31.0-31.9, adult: Secondary | ICD-10-CM

## 2020-04-11 DIAGNOSIS — Z9189 Other specified personal risk factors, not elsewhere classified: Secondary | ICD-10-CM | POA: Diagnosis not present

## 2020-04-11 DIAGNOSIS — E559 Vitamin D deficiency, unspecified: Secondary | ICD-10-CM | POA: Diagnosis not present

## 2020-04-11 DIAGNOSIS — R7303 Prediabetes: Secondary | ICD-10-CM

## 2020-04-12 MED ORDER — VITAMIN D (ERGOCALCIFEROL) 1.25 MG (50000 UNIT) PO CAPS: 50000.0000 [IU] | ORAL_CAPSULE | ORAL | 0 refills | Status: DC

## 2020-04-12 NOTE — Progress Notes (Signed)
Chief Complaint:   OBESITY Steven Zimmerman is here to discuss his progress with his obesity treatment plan along with follow-up of his obesity related diagnoses. Steven Zimmerman is on the Category 4 Plan and states he is following his eating plan approximately 80-90% of the time. Steven Zimmerman states he is doing 0 minutes 0 times per week.  Today's visit was #: 48 Starting weight: 265 lbs Starting date: 06/06/2019 Today's weight: 216 lbs Today's date: 04/11/2020 Total lbs lost to date: 49 Total lbs lost since last in-office visit: 0  Interim History: Steven Zimmerman states that he has been doing extra snacking, and his dinner has not been on the plan consistently.  Subjective:   1. Vitamin D deficiency Steven Zimmerman denies nausea, vomiting, or muscle weakness on Vit D.  2. Pre-diabetes Steven Zimmerman is not currently on any medications. Last A1c has improved from 5.7 to 5.3.  3. At risk for diabetes mellitus Steven Zimmerman is at higher than average risk for developing diabetes due to his obesity.   Assessment/Plan:   1. Vitamin D deficiency Low Vitamin D level contributes to fatigue and are associated with obesity, breast, and colon cancer. We will refill prescription Vitamin D for 1 month. Steven Zimmerman will follow-up for routine testing of Vitamin D, at least 2-3 times per year to avoid over-replacement.  - Vitamin D, Ergocalciferol, (DRISDOL) 1.25 MG (50000 UNIT) CAPS capsule; Take 1 capsule (50,000 Units total) by mouth every 7 (seven) days.  Dispense: 4 capsule; Refill: 0  2. Pre-diabetes Steven Zimmerman will continue to work on weight loss, exercise, and decreasing simple carbohydrates to help decrease the risk of diabetes.   3. At risk for diabetes mellitus Steven Zimmerman was given approximately 15 minutes of diabetes education and counseling today. We discussed intensive lifestyle modifications today with an emphasis on weight loss as well as increasing exercise and decreasing simple carbohydrates in his diet. We also reviewed  medication options with an emphasis on risk versus benefit of those discussed.   Repetitive spaced learning was employed today to elicit superior memory formation and behavioral change.  4. Class 1 obesity with serious comorbidity and body mass index (BMI) of 31.0 to 31.9 in adult, unspecified obesity type Steven Zimmerman is currently in the action stage of change. As such, his goal is to continue with weight loss efforts. He has agreed to the Category 4 Plan.   Exercise goals: No exercise has been prescribed at this time.  Behavioral modification strategies: meal planning and cooking strategies and keeping healthy foods in the home.  Steven Zimmerman has agreed to follow-up with our clinic in 4 weeks. He was informed of the importance of frequent follow-up visits to maximize his success with intensive lifestyle modifications for his multiple health conditions.   Objective:   Blood pressure 122/68, pulse 77, temperature 98.2 F (36.8 C), temperature source Oral, height 5\' 9"  (1.753 m), weight 216 lb (98 kg), SpO2 97 %. Body mass index is 31.9 kg/m.  General: Cooperative, alert, well developed, in no acute distress. HEENT: Conjunctivae and lids unremarkable. Cardiovascular: Regular rhythm.  Lungs: Normal work of breathing. Neurologic: No focal deficits.   Lab Results  Component Value Date   CREATININE 0.97 01/26/2020   BUN 22 01/26/2020   NA 138 01/26/2020   K 4.4 01/26/2020   CL 101 01/26/2020   CO2 20 01/26/2020   Lab Results  Component Value Date   ALT 18 01/26/2020   AST 20 01/26/2020   ALKPHOS 58 01/26/2020   BILITOT 0.6 01/26/2020   Lab  Results  Component Value Date   HGBA1C 5.3 01/26/2020   HGBA1C 5.7 (H) 06/06/2019   Lab Results  Component Value Date   INSULIN 7.1 01/26/2020   INSULIN 12.7 06/06/2019   Lab Results  Component Value Date   TSH 1.18 09/16/2018   Lab Results  Component Value Date   CHOL 164 01/26/2020   HDL 66 01/26/2020   LDLCALC 89 01/26/2020    LDLDIRECT 139.0 09/16/2018   TRIG 44 01/26/2020   CHOLHDL 4 02/10/2018   Lab Results  Component Value Date   WBC 8.0 09/16/2018   HGB 15.4 09/16/2018   HCT 45.7 09/16/2018   MCV 89.8 09/16/2018   PLT 196.0 09/16/2018   No results found for: IRON, TIBC, FERRITIN  Attestation Statements:   Reviewed by clinician on day of visit: allergies, medications, problem list, medical history, surgical history, family history, social history, and previous encounter notes.   Wilhemena Durie, am acting as transcriptionist for [employer], PA-C.  I have reviewed the above documentation for accuracy and completeness, and I agree with the above. Abby Potash, PA-C

## 2020-05-10 ENCOUNTER — Encounter (INDEPENDENT_AMBULATORY_CARE_PROVIDER_SITE_OTHER): Payer: Self-pay | Admitting: Bariatrics

## 2020-05-10 ENCOUNTER — Other Ambulatory Visit: Payer: Self-pay

## 2020-05-10 ENCOUNTER — Ambulatory Visit (INDEPENDENT_AMBULATORY_CARE_PROVIDER_SITE_OTHER): Payer: BC Managed Care – PPO | Admitting: Bariatrics

## 2020-05-10 VITALS — BP 105/69 | HR 85 | Temp 98.3°F | Ht 69.0 in | Wt 211.0 lb

## 2020-05-10 DIAGNOSIS — E669 Obesity, unspecified: Secondary | ICD-10-CM | POA: Diagnosis not present

## 2020-05-10 DIAGNOSIS — R7303 Prediabetes: Secondary | ICD-10-CM | POA: Diagnosis not present

## 2020-05-10 DIAGNOSIS — E7849 Other hyperlipidemia: Secondary | ICD-10-CM | POA: Diagnosis not present

## 2020-05-10 DIAGNOSIS — Z6831 Body mass index (BMI) 31.0-31.9, adult: Secondary | ICD-10-CM

## 2020-05-14 ENCOUNTER — Encounter (INDEPENDENT_AMBULATORY_CARE_PROVIDER_SITE_OTHER): Payer: Self-pay | Admitting: Bariatrics

## 2020-05-14 NOTE — Progress Notes (Signed)
Chief Complaint:   OBESITY Steven Zimmerman is here to discuss his progress with his obesity treatment plan along with follow-up of his obesity related diagnoses. Steven Zimmerman is on the Category 4 Plan and states he is following his eating plan approximately 80% of the time. Steven Zimmerman states he is exercising 0 minutes 0 times per week.  Today's visit was #: 14 Starting weight: 265 lbs Starting date: 06/06/2019 Today's weight: 211 lbs Today's date: 05/10/2020 Total lbs lost to date: 54 Total lbs lost since last in-office visit: 5  Interim History: Steven Zimmerman is down an additional 5 lbs and is doing well overall. He reports doing better with drinking pure water.  Subjective:   Prediabetes. Steven Zimmerman has a diagnosis of prediabetes based on his elevated HgA1c and was informed this puts him at greater risk of developing diabetes. He continues to work on diet and exercise to decrease his risk of diabetes. He denies nausea or hypoglycemia. No polyphagia.  Lab Results  Component Value Date   HGBA1C 5.3 01/26/2020   Lab Results  Component Value Date   INSULIN 7.1 01/26/2020   INSULIN 12.7 06/06/2019   Other hyperlipidemia. Steven Zimmerman is on no medication.  Lab Results  Component Value Date   CHOL 164 01/26/2020   HDL 66 01/26/2020   LDLCALC 89 01/26/2020   LDLDIRECT 139.0 09/16/2018   TRIG 44 01/26/2020   CHOLHDL 4 02/10/2018   Lab Results  Component Value Date   ALT 18 01/26/2020   AST 20 01/26/2020   ALKPHOS 58 01/26/2020   BILITOT 0.6 01/26/2020   The 10-year ASCVD risk score Steven Zimmerman., et al., 2013) is: 5.4%   Values used to calculate the score:     Age: 59 years     Sex: Male     Is Non-Hispanic African American: No     Diabetic: No     Tobacco smoker: Yes     Systolic Blood Pressure: 166 mmHg     Is BP treated: No     HDL Cholesterol: 66 mg/dL     Total Cholesterol: 164 mg/dL  Assessment/Plan:   Prediabetes. Steven Zimmerman will continue to work on weight loss, increasing  activities, increasing healthy fats and protein, and decreasing simple carbohydrates to help decrease the risk of diabetes.   Other hyperlipidemia. Cardiovascular risk and specific lipid/LDL goals reviewed.  We discussed several lifestyle modifications today and Steven Zimmerman will continue to work on diet, exercise and weight loss efforts. Orders and follow up as documented in patient record. He will avoid trans fats, decrease saturated fats, and increase PUFA's and MUFA's.  Counseling Intensive lifestyle modifications are the first line treatment for this issue. . Dietary changes: Increase soluble fiber. Decrease simple carbohydrates. . Exercise changes: Moderate to vigorous-intensity aerobic activity 150 minutes per week if tolerated. . Lipid-lowering medications: see documented in medical record.  Class 1 obesity with serious comorbidity and body mass index (BMI) of 31.0 to 31.9 in adult, unspecified obesity type.  Steven Zimmerman is currently in the action stage of change. As such, his goal is to continue with weight loss efforts. He has agreed to the Category 4 Plan.   He will work on meal planning and intentional eating.   Exercise goals: Steven Zimmerman reports he is doing a lot of yard work.  Behavioral modification strategies: increasing lean protein intake, decreasing simple carbohydrates, increasing vegetables, increasing water intake, decreasing eating out, no skipping meals, meal planning and cooking strategies, keeping healthy foods in the home and  planning for success.  Steven Zimmerman has agreed to follow-up with our clinic in 2-3 weeks. He was informed of the importance of frequent follow-up visits to maximize his success with intensive lifestyle modifications for his multiple health conditions.   Objective:   Blood pressure 105/69, pulse 85, temperature 98.3 F (36.8 C), height 5\' 9"  (1.753 m), weight 211 lb (95.7 kg), SpO2 96 %. Body mass index is 31.16 kg/m.  General: Cooperative, alert, well  developed, in no acute distress. HEENT: Conjunctivae and lids unremarkable. Cardiovascular: Regular rhythm.  Lungs: Normal work of breathing. Neurologic: No focal deficits.   Lab Results  Component Value Date   CREATININE 0.97 01/26/2020   BUN 22 01/26/2020   NA 138 01/26/2020   K 4.4 01/26/2020   CL 101 01/26/2020   CO2 20 01/26/2020   Lab Results  Component Value Date   ALT 18 01/26/2020   AST 20 01/26/2020   ALKPHOS 58 01/26/2020   BILITOT 0.6 01/26/2020   Lab Results  Component Value Date   HGBA1C 5.3 01/26/2020   HGBA1C 5.7 (H) 06/06/2019   Lab Results  Component Value Date   INSULIN 7.1 01/26/2020   INSULIN 12.7 06/06/2019   Lab Results  Component Value Date   TSH 1.18 09/16/2018   Lab Results  Component Value Date   CHOL 164 01/26/2020   HDL 66 01/26/2020   LDLCALC 89 01/26/2020   LDLDIRECT 139.0 09/16/2018   TRIG 44 01/26/2020   CHOLHDL 4 02/10/2018   Lab Results  Component Value Date   WBC 8.0 09/16/2018   HGB 15.4 09/16/2018   HCT 45.7 09/16/2018   MCV 89.8 09/16/2018   PLT 196.0 09/16/2018   No results found for: IRON, TIBC, FERRITIN  Attestation Statements:   Reviewed by clinician on day of visit: allergies, medications, problem list, medical history, surgical history, family history, social history, and previous encounter notes.  Time spent on visit including pre-visit chart review and post-visit charting and care was 20 minutes.   Migdalia Dk, am acting as Location manager for [employer], DO   I have reviewed the above documentation for accuracy and completeness, and I agree with the above.Jearld Lesch, DO

## 2020-06-14 ENCOUNTER — Ambulatory Visit (INDEPENDENT_AMBULATORY_CARE_PROVIDER_SITE_OTHER): Payer: BC Managed Care – PPO | Admitting: Physician Assistant

## 2020-06-14 ENCOUNTER — Encounter (INDEPENDENT_AMBULATORY_CARE_PROVIDER_SITE_OTHER): Payer: Self-pay | Admitting: Physician Assistant

## 2020-06-14 ENCOUNTER — Other Ambulatory Visit: Payer: Self-pay

## 2020-06-14 VITALS — BP 114/73 | HR 82 | Temp 98.1°F | Ht 69.0 in | Wt 208.0 lb

## 2020-06-14 DIAGNOSIS — E559 Vitamin D deficiency, unspecified: Secondary | ICD-10-CM

## 2020-06-14 DIAGNOSIS — E669 Obesity, unspecified: Secondary | ICD-10-CM

## 2020-06-14 DIAGNOSIS — R7303 Prediabetes: Secondary | ICD-10-CM | POA: Diagnosis not present

## 2020-06-14 DIAGNOSIS — Z6830 Body mass index (BMI) 30.0-30.9, adult: Secondary | ICD-10-CM

## 2020-06-18 NOTE — Progress Notes (Signed)
Chief Complaint:   Steven Zimmerman is here to discuss his progress with his Steven treatment plan along with follow-up of his Steven related diagnoses. Steven Zimmerman is on the Category 4 Plan and states he is following his eating plan approximately 75-80% of the time. Steven Zimmerman states he is bike riding 2-3 miles 5 days per week.   Today's visit was #: 15 Starting weight: 265 lbs Starting date: 06/06/2019 Today's weight: 208 lbs Today's date: 06/14/2020 Total lbs lost to date: 57 Total lbs lost since last in-office visit: 3  Interim History: Steven Zimmerman states that he was off plan over the holidays and during his birthday.  He is riding his bike and doing some strength training.  Subjective:   Vitamin D deficiency. [name] is on Vitamin D supplementation, which he is tolerating well.   Ref. Range 01/26/2020 16:41  Vitamin D, 25-Hydroxy Latest Ref Range: 30.0 - 100.0 ng/mL 74.4   Prediabetes. Steven Zimmerman has a diagnosis of prediabetes based on his elevated HgA1c and was informed this puts him at greater risk of developing diabetes. He continues to work on diet and exercise to decrease his risk of diabetes. He denies nausea or hypoglycemia. Steven Zimmerman is on no medication. No polyphagia. He is exercising regularly.  Lab Results  Component Value Date   HGBA1C 5.3 01/26/2020   Lab Results  Component Value Date   INSULIN 7.1 01/26/2020   INSULIN 12.7 06/06/2019   Assessment/Plan:   Vitamin D deficiency. Low Vitamin D level contributes to fatigue and are associated with Steven, breast, and colon cancer. He agrees to continue to take Vitamin D as directed and will follow-up for routine testing of Vitamin D, at least 2-3 times per year to avoid over-replacement.  Prediabetes. Dylon will continue to work on weight loss, exercise, and decreasing simple carbohydrates to help decrease the risk of diabetes.   Class 1 Steven with serious comorbidity and body mass index (BMI) of 30.0 to 30.9  in adult, unspecified Steven type.  Steven Zimmerman is currently in the action stage of change. As such, his goal is to continue with weight loss efforts. He has agreed to the Category 4 Plan.   Exercise goals: For substantial health benefits, adults should do at least 150 minutes (2 hours and 30 minutes) a week of moderate-intensity, or 75 minutes (1 hour and 15 minutes) a week of vigorous-intensity aerobic physical activity, or an equivalent combination of moderate- and vigorous-intensity aerobic activity. Aerobic activity should be performed in episodes of at least 10 minutes, and preferably, it should be spread throughout the week.  Behavioral modification strategies: increasing lean protein intake and meal planning and cooking strategies.  Steven Zimmerman has agreed to follow-up with our clinic in 4-5 weeks. He was informed of the importance of frequent follow-up visits to maximize his success with intensive lifestyle modifications for his multiple health conditions.   Objective:   Blood pressure 114/73, pulse 82, temperature 98.1 F (36.7 C), temperature source Oral, height 5\' 9"  (1.753 m), weight (!) 208 lb (94.3 kg), SpO2 98 %. Body mass index is 30.72 kg/m.  General: Cooperative, alert, well developed, in no acute distress. HEENT: Conjunctivae and lids unremarkable. Cardiovascular: Regular rhythm.  Lungs: Normal work of breathing. Neurologic: No focal deficits.   Lab Results  Component Value Date   CREATININE 0.97 01/26/2020   BUN 22 01/26/2020   NA 138 01/26/2020   K 4.4 01/26/2020   CL 101 01/26/2020   CO2 20 01/26/2020   Lab Results  Component Value Date   ALT 18 01/26/2020   AST 20 01/26/2020   ALKPHOS 58 01/26/2020   BILITOT 0.6 01/26/2020   Lab Results  Component Value Date   HGBA1C 5.3 01/26/2020   HGBA1C 5.7 (H) 06/06/2019   Lab Results  Component Value Date   INSULIN 7.1 01/26/2020   INSULIN 12.7 06/06/2019   Lab Results  Component Value Date   TSH 1.18  09/16/2018   Lab Results  Component Value Date   CHOL 164 01/26/2020   HDL 66 01/26/2020   LDLCALC 89 01/26/2020   LDLDIRECT 139.0 09/16/2018   TRIG 44 01/26/2020   CHOLHDL 4 02/10/2018   Lab Results  Component Value Date   WBC 8.0 09/16/2018   HGB 15.4 09/16/2018   HCT 45.7 09/16/2018   MCV 89.8 09/16/2018   PLT 196.0 09/16/2018   No results found for: IRON, TIBC, FERRITIN  Attestation Statements:   Reviewed by clinician on day of visit: allergies, medications, problem list, medical history, surgical history, family history, social history, and previous encounter notes.  Time spent on visit including pre-visit chart review and post-visit charting and care was 30 minutes.   IMichaelene Song, am acting as transcriptionist for Abby Potash, PA-C   I have reviewed the above documentation for accuracy and completeness, and I agree with the above. Abby Potash, PA-C

## 2020-07-19 ENCOUNTER — Ambulatory Visit (INDEPENDENT_AMBULATORY_CARE_PROVIDER_SITE_OTHER): Payer: BC Managed Care – PPO | Admitting: Physician Assistant

## 2021-02-13 ENCOUNTER — Telehealth: Payer: Self-pay | Admitting: Family Medicine

## 2021-12-01 ENCOUNTER — Encounter: Payer: Self-pay | Admitting: Gastroenterology

## 2022-02-11 ENCOUNTER — Ambulatory Visit (INDEPENDENT_AMBULATORY_CARE_PROVIDER_SITE_OTHER): Payer: Self-pay | Admitting: Family Medicine

## 2022-02-11 ENCOUNTER — Encounter: Payer: Self-pay | Admitting: Family Medicine

## 2022-02-11 VITALS — BP 95/61 | HR 68 | Temp 98.0°F | Wt 240.0 lb

## 2022-02-11 DIAGNOSIS — M791 Myalgia, unspecified site: Secondary | ICD-10-CM

## 2022-02-11 DIAGNOSIS — Z20818 Contact with and (suspected) exposure to other bacterial communicable diseases: Secondary | ICD-10-CM

## 2022-02-11 DIAGNOSIS — R110 Nausea: Secondary | ICD-10-CM

## 2022-02-11 LAB — POCT RAPID STREP A (OFFICE): Rapid Strep A Screen: NEGATIVE

## 2022-02-11 MED ORDER — AMOXICILLIN-POT CLAVULANATE 875-125 MG PO TABS: 1.0000 | ORAL_TABLET | Freq: Two times a day (BID) | ORAL | 0 refills | Status: DC

## 2022-02-11 NOTE — Progress Notes (Signed)
? ? ? ?This visit occurred during the SARS-CoV-2 public health emergency.  Safety protocols were in place, including screening questions prior to the visit, additional usage of staff PPE, and extensive cleaning of exam room while observing appropriate contact time as indicated for disinfecting solutions.  ? ? ?Steven Zimmerman , 10/13/1961, 61 y.o., male ?MRN: [id number] ?Patient Care Team  ?  Relationship Specialty Notifications Start End  ?Libby Maw, MD PCP - General Family Medicine  02/05/18   ? ? ?Chief Complaint  ?Patient presents with  ? Nausea  ?  Pt c/o nausea, body aches x 1 day; pt been exposed to strep at home  ? ?  ?Subjective: Pt presents for an OV with complaints of nausea, body aches that started yesterday.  He reports he was exposed to strep infection at home his grandchildren.  He reports he has taken Tylenol and Advil to help with his symptoms. ? ?Depression screen [facility] 2/9 06/06/2019  ?Decreased Interest 1  ?Down, Depressed, Hopeless 1  ?PHQ - 2 Score 2  ?Altered sleeping 1  ?Tired, decreased energy 2  ?Change in appetite 1  ?Feeling bad or failure about yourself  0  ?Trouble concentrating 0  ?Moving slowly or fidgety/restless 0  ?Suicidal thoughts 0  ?PHQ-9 Score 6  ?Difficult doing work/chores Somewhat difficult  ? ? ?No Known Allergies ?Social History  ? ?Social History Narrative  ? Diet: regular, problem w/ portion control-----exercise: nothing regular   ? ?Past Medical History:  ?Diagnosis Date  ? Anxiety   ? Back pain   ? Cancer [facility])   ? skin cancer  ? Colon polyps 2006  ? hyperplastic polyps  ? GERD (gastroesophageal reflux disease)   ? History of hiatal hernia   ? umbilical and hiatal  ? Joint pain   ? Pneumonia   ? Viral as a child  ? Vitamin D deficiency   ? ?Past Surgical History:  ?Procedure Laterality Date  ? HERNIA REPAIR  2008  ? x2 last in 2015  ? INSERTION OF MESH N/A 01/15/2017  ? Procedure: INSERTION OF MESH;  Surgeon: Ralene Ok, MD;  Location: [facility];  Service:  General;  Laterality: N/A;  ? [place]  ? VENTRAL HERNIA REPAIR N/A 01/15/2017  ? Procedure: LAPRASCOPIC ASSISTED OPEN RECURRENT VENTRAL HERNIA REPAIR WITH MESH;  Surgeon: Ralene Ok, MD;  Location: [facility];  Service: General;  Laterality: N/A;  ? ?Family History  ?Problem Relation Age of Onset  ? Colon cancer Mother   ? Diabetes Mother   ? High blood pressure Mother   ? Stroke Mother   ? Depression Mother   ? Anxiety disorder Mother   ? Obesity Mother   ? Esophageal cancer Father   ? Prostate cancer Neg Hx   ? Coronary artery disease Neg Hx   ? ?Allergies as of 02/11/2022   ?No Known Allergies ?  ? ?  ?Medication List  ?  ? ?  ? Accurate as of February 11, 2022  2:33 PM. If you have any questions, ask your nurse or doctor.  ?  ?  ? ?  ? ?STOP taking these medications   ? ?Vitamin D (Ergocalciferol) 1.25 MG (50000 UNIT) Caps capsule ?Commonly known as: DRISDOL ?Stopped by: Howard Pouch, DO ?  ? ?  ? ? ?All past medical history, surgical history, allergies, family history, immunizations andmedications were updated in the EMR today and reviewed under the history and medication portions of their EMR.    ? ?  Review of Systems  ?Constitutional:  Positive for chills, fever and malaise/fatigue.  ?HENT:  Negative for congestion, ear discharge, ear pain, nosebleeds, sinus pain and sore throat.   ?Respiratory:  Negative for cough, shortness of breath and wheezing.   ?Gastrointestinal:  Positive for diarrhea and nausea. Negative for vomiting.  ?Musculoskeletal:  Positive for myalgias (Fatigued).  ?Skin:  Negative for itching and rash.  ?Neurological:  Negative for dizziness, weakness and headaches.  ?Negative, with the exception of above mentioned in HPI ? ? ?Objective:  ?BP 95/61   Pulse 68   Temp 98 ?F (36.7 ?C) (Oral)   Wt 240 lb (108.9 kg)   SpO2 97%   BMI 35.44 kg/m?  ?Body mass index is 35.44 kg/m?Marland Kitchen ?Physical Exam ?Vitals and nursing note reviewed.  ?Constitutional:   ?   General: He is not in acute distress. ?    Appearance: Normal appearance. He is not ill-appearing, toxic-appearing or diaphoretic.  ?HENT:  ?   Head: Normocephalic and atraumatic.  ?   Right Ear: Tympanic membrane, ear canal and external ear normal. There is no impacted cerumen.  ?   Left Ear: Tympanic membrane, ear canal and external ear normal. There is no impacted cerumen.  ?   Nose: Nose normal. No congestion or rhinorrhea.  ?   Mouth/Throat:  ?   Mouth: Mucous membranes are moist.  ?   Pharynx: Oropharynx is clear. No oropharyngeal exudate or posterior oropharyngeal erythema.  ?Eyes:  ?   General: No scleral icterus.    ?   Right eye: No discharge.     ?   Left eye: No discharge.  ?   Extraocular Movements: Extraocular movements intact.  ?   Conjunctiva/sclera: Conjunctivae normal.  ?   Pupils: Pupils are equal, round, and reactive to light.  ?Cardiovascular:  ?   Rate and Rhythm: Normal rate and regular rhythm.  ?   Heart sounds: No murmur heard. ?Pulmonary:  ?   Effort: Pulmonary effort is normal. No respiratory distress.  ?   Breath sounds: Normal breath sounds. No wheezing, rhonchi or rales.  ?Abdominal:  ?   General: Bowel sounds are normal.  ?   Palpations: Abdomen is soft.  ?Musculoskeletal:  ?   Cervical back: Neck supple. No tenderness.  ?   Right lower leg: No edema.  ?   Left lower leg: No edema.  ?Lymphadenopathy:  ?   Cervical: No cervical adenopathy.  ?Skin: ?   General: Skin is warm and dry.  ?   Coloration: Skin is not jaundiced or pale.  ?   Findings: No rash.  ?Neurological:  ?   Mental Status: He is alert and oriented to person, place, and time. Mental status is at baseline.  ?Psychiatric:     ?   Mood and Affect: Mood normal.     ?   Behavior: Behavior normal.     ?   Thought Content: Thought content normal.     ?   Judgment: Judgment normal.  ? ? ? ?No results found. ?No results found. ?No results found for this or any previous visit (from the past 24 hour(s)). ? ?Assessment/Plan: ?Steven Zimmerman is a 61 y.o. male present for OV  for  ?Nausea/myalgia/strep exposure ?Exam essentially normal.  If he has a strep infection it is very early within the illness. ?Rest, hydrate.  If no fever for 24 hours or worsening symptoms, should be okay to return to work tomorrow. ?Patient was provided with  prescription for Augmentin and hand.  He understands to only start this medication if his symptoms worsen or we call him with a positive culture. ?Continue OTC supportive measures for symptoms. ?- POCT rapid strep A > negative today.  Culture has been sent ?- Upper Respiratory Culture ? ? ?Reviewed expectations re: course of current medical issues. ?Discussed self-management of symptoms. ?Outlined signs and symptoms indicating need for more acute intervention. ?Patient verbalized understanding and all questions were answered. ?Patient received an After-Visit Summary. ? ? ? ?No orders of the defined types were placed in this encounter. ? ?No orders of the defined types were placed in this encounter. ? ?Referral Orders  ?No referral(s) requested today  ? ? ? ?Note is dictated utilizing voice recognition software. Although note has been proof read prior to signing, occasional typographical errors still can be missed. If any questions arise, please do not hesitate to call for verification.  ? ?electronically signed by: ? ?Howard Pouch, DO  ?[facility] [place] ? ? ? ?

## 2022-02-11 NOTE — Patient Instructions (Signed)
?Great to see you today.  ?I have refilled the medication(s) we provide.  ? ?If labs were collected, we will inform you of lab results once received either by echart message or telephone call.  ? - echart message- for normal results that have been seen by the patient already.  ? - telephone call: abnormal results or if patient has not viewed results in their echart. ? ?Strep Throat, Adult ?Strep throat is an infection of the throat. It is caused by germs (bacteria). Strep throat is common during the cold months of the year. It mostly affects children who are 79-56 years old. However, people of all ages can get it at any time of the year. This infection spreads from person to person through coughing, sneezing, or having close contact. ?What are the causes? ?This condition is caused by the Streptococcus pyogenes germ. ?What increases the risk? ?You care for young children. Children are more likely to get strep throat and may spread it to others. ?You go to crowded places. Germs can spread easily in such places. ?You kiss or touch someone who has strep throat. ?What are the signs or symptoms? ?Fever or chills. ?Redness, swelling, or pain in the tonsils or throat. ?Pain or trouble when swallowing. ?White or yellow spots on the tonsils or throat. ?Tender glands in the neck and under the jaw. ?Bad breath. ?Red rash all over the body. This is rare. ?How is this treated? ?Medicines that kill germs (antibiotics). ?Medicines that treat pain or fever. These include: ?Ibuprofen or acetaminophen. ?Aspirin, only for people who are over the age of 33. ?Cough drops. ?Throat sprays. ?Follow these instructions at home: ?Medicines ? ?Take over-the-counter and prescription medicines only as told by your doctor. ?Take your antibiotic medicine as told by your doctor. Do not stop taking the antibiotic even if you start to feel better. ?Eating and drinking ? ?If you have trouble swallowing, eat soft foods until your throat feels  better. ?Drink enough fluid to keep your pee (urine) pale yellow. ?To help with pain, you may have: ?Warm fluids, such as soup and tea. ?Cold fluids, such as frozen desserts or popsicles. ?General instructions ?Rinse your mouth (gargle) with a salt-water mixture 3-4 times a day or as needed. To make a salt-water mixture, dissolve ?-1 tsp (3-6 g) of salt in 1 cup (237 mL) of warm water. ?Rest as much as you can. ?Stay home from work or school until you have been taking antibiotics for 24 hours. ?Do not smoke or use any products that contain nicotine or tobacco. If you need help quitting, ask your doctor. ?Keep all follow-up visits. ?How is this prevented? ? ?Do not share food, drinking cups, or personal items. They can cause the germs to spread. ?Wash your hands well with soap and water. Make sure that all people in your house wash their hands well. ?Have family members tested if they have a fever or a sore throat. They may need an antibiotic if they have strep throat. ?Contact a doctor if: ?You have swelling in your neck that keeps getting bigger. ?You get a rash, cough, or earache. ?You cough up a thick fluid that is green, yellow-brown, or bloody. ?You have pain that does not get better with medicine. ?Your symptoms get worse instead of getting better. ?You have a fever. ?Get help right away if: ?You vomit. ?You have a very bad headache. ?Your neck hurts or feels stiff. ?You have chest pain or are short of breath. ?You have drooling,  very bad throat pain, or changes in your voice. ?Your neck is swollen, or the skin gets red and tender. ?Your mouth is dry, or you are peeing less than normal. ?You keep feeling more tired or have trouble waking up. ?Your joints are red or painful. ?These symptoms may be an emergency. Do not wait to see if the symptoms will go away. Get help right away. Call your local emergency services (911 in the [place] ?Summary ?Strep throat is an infection of the throat. It is caused by germs  (bacteria). ?This infection can spread from person to person through coughing, sneezing, or having close contact. ?Take your medicines, including antibiotics, as told by your doctor. Do not stop taking the antibiotic even if you start to feel better. ?To prevent the spread of germs, wash your hands well with soap and water. Have others do the same. Do not share food, drinking cups, or personal items. ?Get help right away if you have a bad headache, chest pain, shortness of breath, a stiff or painful neck, or you vomit. ?This information is not intended to replace advice given to you by your health care provider. Make sure you discuss any questions you have with your health care provider. ?Document Revised: 03/05/2021 Document Reviewed: 03/05/2021 ?Elsevier Patient Education ? [place]. ? ?

## 2022-02-14 LAB — CULTURE, UPPER RESPIRATORY
MICRO NUMBER:: 13162488
SPECIMEN QUALITY:: ADEQUATE

## 2022-04-17 DIAGNOSIS — Z6833 Body mass index (BMI) 33.0-33.9, adult: Secondary | ICD-10-CM | POA: Diagnosis not present

## 2022-04-17 DIAGNOSIS — E669 Obesity, unspecified: Secondary | ICD-10-CM | POA: Diagnosis not present

## 2022-04-17 DIAGNOSIS — K432 Incisional hernia without obstruction or gangrene: Secondary | ICD-10-CM | POA: Diagnosis not present

## 2022-07-02 ENCOUNTER — Encounter (INDEPENDENT_AMBULATORY_CARE_PROVIDER_SITE_OTHER): Payer: Self-pay

## 2022-08-05 ENCOUNTER — Other Ambulatory Visit: Payer: Self-pay | Admitting: Surgery

## 2022-08-05 DIAGNOSIS — E669 Obesity, unspecified: Secondary | ICD-10-CM

## 2022-08-05 DIAGNOSIS — K432 Incisional hernia without obstruction or gangrene: Secondary | ICD-10-CM

## 2022-08-29 ENCOUNTER — Ambulatory Visit
Admission: RE | Admit: 2022-08-29 | Discharge: 2022-08-29 | Disposition: A | Discharge status: Home / Self Care | Payer: BC Managed Care – PPO | Source: Ambulatory Visit | Attending: Surgery | Admitting: Surgery

## 2022-08-29 DIAGNOSIS — N400 Enlarged prostate without lower urinary tract symptoms: Secondary | ICD-10-CM | POA: Diagnosis not present

## 2022-08-29 DIAGNOSIS — E669 Obesity, unspecified: Secondary | ICD-10-CM

## 2022-08-29 DIAGNOSIS — K432 Incisional hernia without obstruction or gangrene: Secondary | ICD-10-CM

## 2022-08-29 DIAGNOSIS — K439 Ventral hernia without obstruction or gangrene: Secondary | ICD-10-CM | POA: Diagnosis not present

## 2022-10-09 DIAGNOSIS — Z9889 Other specified postprocedural states: Secondary | ICD-10-CM | POA: Diagnosis not present

## 2022-10-09 DIAGNOSIS — K432 Incisional hernia without obstruction or gangrene: Secondary | ICD-10-CM | POA: Diagnosis not present

## 2022-10-09 DIAGNOSIS — Z48815 Encounter for surgical aftercare following surgery on the digestive system: Secondary | ICD-10-CM | POA: Diagnosis not present

## 2022-11-14 DIAGNOSIS — K432 Incisional hernia without obstruction or gangrene: Secondary | ICD-10-CM | POA: Diagnosis not present

## 2022-12-24 DIAGNOSIS — K432 Incisional hernia without obstruction or gangrene: Secondary | ICD-10-CM | POA: Diagnosis not present

## 2023-03-09 DIAGNOSIS — Z8719 Personal history of other diseases of the digestive system: Secondary | ICD-10-CM | POA: Diagnosis not present

## 2023-03-09 DIAGNOSIS — Z9889 Other specified postprocedural states: Secondary | ICD-10-CM | POA: Diagnosis not present

## 2023-06-15 DIAGNOSIS — G5761 Lesion of plantar nerve, right lower limb: Secondary | ICD-10-CM | POA: Diagnosis not present

## 2023-06-15 DIAGNOSIS — M79671 Pain in right foot: Secondary | ICD-10-CM | POA: Diagnosis not present

## 2023-06-15 DIAGNOSIS — M25571 Pain in right ankle and joints of right foot: Secondary | ICD-10-CM | POA: Diagnosis not present

## 2023-06-22 ENCOUNTER — Encounter: Payer: Self-pay | Admitting: Family Medicine

## 2023-06-22 ENCOUNTER — Ambulatory Visit (INDEPENDENT_AMBULATORY_CARE_PROVIDER_SITE_OTHER): Payer: BC Managed Care – PPO | Admitting: Family Medicine

## 2023-06-22 VITALS — BP 138/86 | HR 80 | Temp 97.4°F | Ht 69.0 in | Wt 241.4 lb

## 2023-06-22 DIAGNOSIS — Z131 Encounter for screening for diabetes mellitus: Secondary | ICD-10-CM

## 2023-06-22 DIAGNOSIS — Z13220 Encounter for screening for lipoid disorders: Secondary | ICD-10-CM

## 2023-06-22 DIAGNOSIS — R0683 Snoring: Secondary | ICD-10-CM | POA: Diagnosis not present

## 2023-06-22 DIAGNOSIS — R972 Elevated prostate specific antigen [PSA]: Secondary | ICD-10-CM | POA: Diagnosis not present

## 2023-06-22 DIAGNOSIS — Z125 Encounter for screening for malignant neoplasm of prostate: Secondary | ICD-10-CM | POA: Diagnosis not present

## 2023-06-22 DIAGNOSIS — Z6835 Body mass index (BMI) 35.0-35.9, adult: Secondary | ICD-10-CM | POA: Insufficient documentation

## 2023-06-22 DIAGNOSIS — Z0000 Encounter for general adult medical examination without abnormal findings: Secondary | ICD-10-CM | POA: Diagnosis not present

## 2023-06-22 DIAGNOSIS — K439 Ventral hernia without obstruction or gangrene: Secondary | ICD-10-CM

## 2023-06-22 NOTE — Progress Notes (Unsigned)
Established Patient Office Visit   Subjective:  Patient ID: Steven Zimmerman, male    DOB: 07-27-1961  Age: 62 y.o. MRN: [id number]  Chief Complaint  Patient presents with   Obesity    Pt wants to start working on weight loss.     HPI Encounter Diagnoses  Name Primary?   BMI 35.0-35.9,adult Yes   Healthcare maintenance    Screening for hyperlipidemia    Screening for prostate cancer    Screening for diabetes mellitus    Ventral hernia without obstruction or gangrene    Snores    For follow-up and reestablishment of care.  Have not seen in the office in 5 years.  He still deals with a large ventral hernia.  He has now been to at least 3 separate surgeons for evaluation.  He has been seen at [facility] and then at [facility].  Weight loss has been recommended before surgery but after doing so it seems as though he was told that surgery would not be helpful.  He has since quit smoking a couple years ago.  Significantly reduced his alcohol intake from a taser to over the weekends down to an occasional mixed drink.  Continues to work full-time.  He is now raising 3 grandchildren.  No history of hypertension. {History (Optional):23778}  Review of Systems  Constitutional: Negative.   HENT: Negative.    Eyes:  Negative for blurred vision, discharge and redness.  Respiratory: Negative.    Cardiovascular: Negative.   Gastrointestinal:  Negative for abdominal pain.  Genitourinary: Negative.   Musculoskeletal: Negative.  Negative for myalgias.  Skin:  Negative for rash.  Neurological:  Negative for tingling, loss of consciousness and weakness.  Endo/Heme/Allergies:  Negative for polydipsia.     Current Outpatient Medications:    predniSONE (DELTASONE) 20 MG tablet, PLEASE SEE ATTACHED FOR DETAILED DIRECTIONS, Disp: , Rfl:    amoxicillin-clavulanate (AUGMENTIN) 875-125 MG tablet, Take 1 tablet by mouth 2 (two) times daily. (Patient not taking: Reported on 06/22/2023), Disp: 20  tablet, Rfl: 0   Objective:     BP 138/86   Pulse 80   Temp (!) 97.4 F (36.3 C)   Ht 5\' 9"  (1.753 m)   Wt 241 lb 6.4 oz (109.5 kg)   SpO2 96%   BMI 35.65 kg/m  BP Readings from Last 3 Encounters:  06/22/23 138/86  02/11/22 95/61  06/14/20 114/73   Wt Readings from Last 3 Encounters:  06/22/23 241 lb 6.4 oz (109.5 kg)  02/11/22 240 lb (108.9 kg)  06/14/20 (!) 208 lb (94.3 kg)      Physical Exam Constitutional:      General: He is not in acute distress.    Appearance: Normal appearance. He is not ill-appearing, toxic-appearing or diaphoretic.  HENT:     Head: Normocephalic and atraumatic.     Right Ear: External ear normal.     Left Ear: External ear normal.     Mouth/Throat:     Mouth: Mucous membranes are moist.     Pharynx: Oropharynx is clear. No oropharyngeal exudate or posterior oropharyngeal erythema.  Eyes:     General: No scleral icterus.       Right eye: No discharge.        Left eye: No discharge.     Extraocular Movements: Extraocular movements intact.     Conjunctiva/sclera: Conjunctivae normal.     Pupils: Pupils are equal, round, and reactive to light.  Cardiovascular:     Rate  and Rhythm: Normal rate and regular rhythm.  Pulmonary:     Effort: Pulmonary effort is normal. No respiratory distress.     Breath sounds: Normal breath sounds.  Abdominal:     General: Bowel sounds are normal.     Tenderness: There is no abdominal tenderness. There is no guarding.     Hernia: A hernia is present. Hernia is present in the ventral area.     Comments: Large ventral hernia is present that is partially reducible.  There is no tenderness to palpation.  Musculoskeletal:     Cervical back: No rigidity or tenderness.  Skin:    General: Skin is warm and dry.  Neurological:     Mental Status: He is alert and oriented to person, place, and time.  Psychiatric:        Mood and Affect: Mood normal.        Behavior: Behavior normal.      No results found for  any visits on 06/22/23.  {Labs (Optional):23779}  The ASCVD Risk score (Arnett DK, et al., 2019) failed to calculate for the following reasons:   Cannot find a previous HDL lab   Cannot find a previous total cholesterol lab    Assessment & Plan:   BMI 35.0-35.9,adult -     Amb Ref to Medical Weight Management  Healthcare maintenance -     CBC -     Comprehensive metabolic panel -     Urinalysis, Routine w reflex microscopic  Screening for hyperlipidemia -     LDL cholesterol, direct  Screening for prostate cancer -     PSA  Screening for diabetes mellitus -     Hemoglobin A1c  Ventral hernia without obstruction or gangrene  Snores -     Ambulatory referral to Pulmonology    Return in about 6 months (around 12/23/2023), or if symptoms worsen or fail to improve.    Mliss Sax, MD

## 2023-06-24 NOTE — Addendum Note (Signed)
Addended by: Andrez Grime on: 06/24/2023 08:28 AM   Modules accepted: Orders

## 2023-10-20 ENCOUNTER — Ambulatory Visit: Payer: BC Managed Care – PPO | Admitting: Dietician

## 2023-10-20 ENCOUNTER — Emergency Department (HOSPITAL_COMMUNITY): Payer: BC Managed Care – PPO

## 2023-10-20 ENCOUNTER — Encounter (HOSPITAL_COMMUNITY): Payer: Self-pay

## 2023-10-20 ENCOUNTER — Emergency Department (HOSPITAL_COMMUNITY)
Admission: EM | Admit: 2023-10-20 | Discharge: 2023-10-20 | Disposition: A | Discharge status: Home / Self Care | Payer: BC Managed Care – PPO | Attending: Emergency Medicine | Admitting: Emergency Medicine

## 2023-10-20 ENCOUNTER — Other Ambulatory Visit: Payer: Self-pay

## 2023-10-20 DIAGNOSIS — M25461 Effusion, right knee: Secondary | ICD-10-CM | POA: Insufficient documentation

## 2023-10-20 DIAGNOSIS — M25561 Pain in right knee: Secondary | ICD-10-CM | POA: Insufficient documentation

## 2023-10-20 DIAGNOSIS — Z85828 Personal history of other malignant neoplasm of skin: Secondary | ICD-10-CM | POA: Insufficient documentation

## 2023-10-20 DIAGNOSIS — M238X1 Other internal derangements of right knee: Secondary | ICD-10-CM | POA: Diagnosis not present

## 2023-10-20 LAB — BASIC METABOLIC PANEL
Anion gap: 8 (ref 5–15)
BUN: 25 mg/dL — ABNORMAL HIGH (ref 8–23)
CO2: 25 mmol/L (ref 22–32)
Calcium: 8.7 mg/dL — ABNORMAL LOW (ref 8.9–10.3)
Chloride: 102 mmol/L (ref 98–111)
Creatinine, Ser: 0.91 mg/dL (ref 0.61–1.24)
GFR, Estimated: >60 mL/min (ref >60)
Glucose, Bld: 98 mg/dL (ref 70–99)
Potassium: 4.3 mmol/L (ref 3.5–5.1)
Sodium: 135 mmol/L (ref 135–145)

## 2023-10-20 LAB — CBC WITH DIFFERENTIAL/PLATELET
Abs Immature Granulocytes: 0.04 10*3/uL (ref 0.00–0.07)
Basophils Absolute: 0 10*3/uL (ref 0.0–0.1)
Basophils Relative: 0 %
Eosinophils Absolute: 0.1 10*3/uL (ref 0.0–0.5)
Eosinophils Relative: 1 %
HCT: 46 % (ref 39.0–52.0)
Hemoglobin: 15.8 g/dL (ref 13.0–17.0)
Immature Granulocytes: 0 %
Lymphocytes Relative: 16 %
Lymphs Abs: 1.6 10*3/uL (ref 0.7–4.0)
MCH: 31.2 pg (ref 26.0–34.0)
MCHC: 34.3 g/dL (ref 30.0–36.0)
MCV: 90.7 fL (ref 80.0–100.0)
Monocytes Absolute: 0.8 10*3/uL (ref 0.1–1.0)
Monocytes Relative: 8 %
Neutro Abs: 7.3 10*3/uL (ref 1.7–7.7)
Neutrophils Relative %: 75 %
Platelets: 182 10*3/uL (ref 150–400)
RBC: 5.07 MIL/uL (ref 4.22–5.81)
RDW: 13.1 % (ref 11.5–15.5)
WBC: 9.9 10*3/uL (ref 4.0–10.5)
nRBC: 0 % (ref 0.0–0.2)

## 2023-10-20 LAB — URIC ACID: Uric Acid, Serum: 5.6 mg/dL (ref 3.7–8.6)

## 2023-10-20 LAB — SEDIMENTATION RATE: Sed Rate: 9 mm/h (ref 0–16)

## 2023-10-20 MED ORDER — KETOROLAC TROMETHAMINE 15 MG/ML IJ SOLN
15.0000 mg | Freq: Once | INTRAMUSCULAR | Status: AC
  Administered 2023-10-20: 15 mg via INTRAVENOUS
  Filled 2023-10-20: qty 1

## 2023-10-20 MED ORDER — HYDROMORPHONE HCL 1 MG/ML IJ SOLN
0.5000 mg | Freq: Once | INTRAMUSCULAR | Status: AC
  Administered 2023-10-20: 0.5 mg via INTRAVENOUS
  Filled 2023-10-20: qty 1

## 2023-10-20 MED ORDER — METHYLPREDNISOLONE 4 MG PO TBPK: ORAL_TABLET | ORAL | 0 refills | Status: DC

## 2023-10-20 MED ORDER — OXYCODONE-ACETAMINOPHEN 5-325 MG PO TABS: 1.0000 | ORAL_TABLET | Freq: Three times a day (TID) | ORAL | 0 refills | Status: DC | PRN

## 2023-10-20 NOTE — ED Triage Notes (Signed)
Pt arrived reporting R knee pain and swelling of the R leg since 0300 this morning. Warm to touch and swelling . Pedal pulse palpable. Patient states he also had chills PTA, afebrile. NO recent travel hx

## 2023-10-20 NOTE — Discharge Instructions (Addendum)
Watch for signs of infection, fevers, increasing redness.

## 2023-10-20 NOTE — ED Provider Notes (Signed)
[facility] Provider Note   CSN: [id number] Arrival date & time: 10/20/23  1728     History  Chief Complaint  Patient presents with   Leg Pain    Steven Zimmerman is a 62 y.o. male.   Leg Pain Patient presents with right knee pain.  Began this morning at around 3 in the morning.  Has had swelling and pain since.  Had been physical active over the weekend but no specific injury.  May have had some chills.  No travel.  Does have some swelling the lower leg.  Has a chronic ventral hernia.    Past Medical History:  Diagnosis Date   Anxiety    Back pain    Cancer (HCC)    skin cancer   Colon polyps 2006   hyperplastic polyps   GERD (gastroesophageal reflux disease)    History of hiatal hernia    umbilical and hiatal   Joint pain    Pneumonia    Viral as a child   Vitamin D deficiency    Additional [place] Home Medications Prior to Admission medications   Medication Sig Start Date End Date Taking? Authorizing Provider  methylPREDNISolone (MEDROL DOSEPAK) 4 MG TBPK tablet Use per box directions. 10/20/23  Yes Benjiman Core, MD  oxyCODONE-acetaminophen (PERCOCET/ROXICET) 5-325 MG tablet Take 1-2 tablets by mouth every 8 (eight) hours as needed for severe pain (pain score 7-10). 10/20/23  Yes Benjiman Core, MD  amoxicillin-clavulanate (AUGMENTIN) 875-125 MG tablet Take 1 tablet by mouth 2 (two) times daily. Patient not taking: Reported on 06/22/2023 02/11/22   Felix Pacini A, DO      Allergies    Patient has no known allergies.    Review of Systems   Review of Systems  Physical Exam Updated Vital Signs BP (!) 142/82 (BP Location: Right Arm)   Pulse 99   Temp 98.4 F (36.9 C) (Oral)   Resp 18   Ht 5\' 9"  (1.753 m)   Wt 109.5 kg   SpO2 97%   BMI 35.65 kg/m  Physical Exam Vitals and nursing note reviewed.  HENT:     Head: Normocephalic.  Abdominal:     Comments: Chronic ventral hernia.  Musculoskeletal:         General: Tenderness present.     Comments: Erythema with some effusion to right knee.  Some pain with movement.  There are some edema distal to this.  Neurovasc intact distally.  No edema of thigh.  Skin:    Capillary Refill: Capillary refill takes more than 3 seconds.  Neurological:     Mental Status: He is alert.     ED Results / Procedures / Treatments   Labs (all labs ordered are listed, but only abnormal results are displayed) Labs Reviewed  BASIC METABOLIC PANEL - Abnormal; Notable for the following components:      Result Value   BUN 25 (*)    Calcium 8.7 (*)    All other components within normal limits  URIC ACID  CBC WITH DIFFERENTIAL/PLATELET  SEDIMENTATION RATE  CBC WITH DIFFERENTIAL/PLATELET  C-REACTIVE PROTEIN    EKG None  Radiology DG Knee Complete 4 Views Right  Result Date: 10/20/2023 CLINICAL DATA:  Right knee pain and swelling EXAM: RIGHT KNEE - COMPLETE 4+ VIEW COMPARISON:  None Available. FINDINGS: No acute fracture or dislocation. Mild joint space narrowing in the medial compartment. Small knee joint effusion. Soft tissue swelling anterior to the patella. IMPRESSION: 1. No  acute fracture or dislocation. 2. Small knee joint effusion. 3. Soft tissue swelling anterior to the patella. Electronically Signed   By: Minerva Fester M.D.   On: 10/20/2023 20:17    Procedures Procedures    Medications Ordered in ED Medications  ketorolac (TORADOL) 15 MG/ML injection 15 mg (15 mg Intravenous Given 10/20/23 1955)    ED Course/ Medical Decision Making/ A&P                                 Medical Decision Making Amount and/or Complexity of Data Reviewed Labs: ordered. Radiology: ordered.  Risk Prescription drug management.   Patient swelling and pain of right knee.  Does have erythema on the knee but it appears more likely that is reactive.  Less likely cellulitis.  Will get basic blood work including sed rate CRP and uric acid.  Differential  diagnosis does include effusion, internal derangement, gout, and cellulitis of the knee.  Septic knee considered but at this point felt less likely.  DVT also felt less likely.  Will get x-ray also.  X-ray does show small effusion.  White count normal and sed rate normal.  Severe infection felt less likely.  Potentially could have a gout.  Will treat with steroids.  Do not think we need antibiotics at this time.  Has seen [facility] in the past will have follow-up with them.  Will discharge home.        Final Clinical Impression(s) / ED Diagnoses Final diagnoses:  Acute pain of right knee    Rx / DC Orders ED Discharge Orders          Ordered    methylPREDNISolone (MEDROL DOSEPAK) 4 MG TBPK tablet        10/20/23 2203    oxyCODONE-acetaminophen (PERCOCET/ROXICET) 5-325 MG tablet  Every 8 hours PRN        10/20/23 2206              Benjiman Core, MD 10/20/23 2207

## 2023-10-21 LAB — C-REACTIVE PROTEIN: CRP: 3.2 mg/dL — ABNORMAL HIGH (ref <1.0)

## 2024-03-10 ENCOUNTER — Ambulatory Visit: Payer: Self-pay

## 2024-03-10 NOTE — Telephone Encounter (Signed)
 Copied from CRM [id number]. Topic: Clinical - Red Word Triage >> Mar 10, 2024  8:54 AM [place] A wrote: [facility] that prompted transfer to Nurse Triage: Patient's wife called because he has swelling in both legs and ankles    Chief Complaint: Swelling both feet, ankles. Symptoms: Above Frequency: 1 month Pertinent Negatives: Patient denies pain Disposition: [] ED /[] Urgent Care (no appt availability in office) / [x] Appointment(In office/virtual)/ []  [facility]/ [] Home Care/ [] Refused Recommended Disposition /[] [facility]/ []  Follow-up with PCP Additional Notes: Wife agrees with appointment.  Reason for Disposition  [1] MILD swelling of both ankles (i.e., pedal edema) AND [2] new-onset or worsening  Answer Assessment - Initial Assessment Questions 1. ONSET: "When did the swelling start?" (e.g., minutes, hours, days)     1 month 2. LOCATION: "What part of the leg is swollen?"  "Are both legs swollen or just one leg?"     Both legs 3. SEVERITY: "How bad is the swelling?" (e.g., localized; mild, moderate, severe)   - Localized: Small area of swelling localized to one leg.   - MILD pedal edema: Swelling limited to foot and ankle, pitting edema < 1/4 inch (6 mm) deep, rest and elevation eliminate most or all swelling.   - MODERATE edema: Swelling of lower leg to knee, pitting edema > 1/4 inch (6 mm) deep, rest and elevation only partially reduce swelling.   - SEVERE edema: Swelling extends above knee, facial or hand swelling present.      Ankle 4. REDNESS: "Does the swelling look red or infected?"     no 5. PAIN: "Is the swelling painful to touch?" If Yes, ask: "How painful is it?"   (Scale 1-10; mild, moderate or severe)     no 6. FEVER: "Do you have a fever?" If Yes, ask: "What is it, how was it measured, and when did it start?"      no 7. CAUSE: "What do you think is causing the leg swelling?"     unsure 8. MEDICAL HISTORY: "Do you have a history of blood clots  (e.g., DVT), cancer, heart failure, kidney disease, or liver failure?"     no 9. RECURRENT SYMPTOM: "Have you had leg swelling before?" If Yes, ask: "When was the last time?" "What happened that time?"     no 10. OTHER SYMPTOMS: "Do you have any other symptoms?" (e.g., chest pain, difficulty breathing)       Wheezing 11. PREGNANCY: "Is there any chance you are pregnant?" "When was your last menstrual period?"       N/a  Protocols used: Leg Swelling and Edema-A-AH

## 2024-03-16 ENCOUNTER — Encounter: Payer: Self-pay | Admitting: Internal Medicine

## 2024-03-16 ENCOUNTER — Other Ambulatory Visit: Payer: Self-pay | Admitting: Internal Medicine

## 2024-03-16 ENCOUNTER — Ambulatory Visit: Admitting: Internal Medicine

## 2024-03-16 DIAGNOSIS — K439 Ventral hernia without obstruction or gangrene: Secondary | ICD-10-CM

## 2024-03-16 MED ORDER — WEGOVY 0.5 MG/0.5ML ~~LOC~~ SOAJ: 0.5000 mg | SUBCUTANEOUS | 1 refills | Status: DC

## 2024-03-16 MED ORDER — WEGOVY 0.25 MG/0.5ML ~~LOC~~ SOAJ: SUBCUTANEOUS | 0 refills | Status: DC

## 2024-03-16 NOTE — Telephone Encounter (Signed)
 Can you help with this?

## 2024-03-16 NOTE — Progress Notes (Unsigned)
 [facility] [address] [place] [address] Dept: [phone] Dept Fax: [phone]  Acute Care Office Visit  Subjective:   Steven Zimmerman [birth year] 03/16/2024  Chief Complaint  Patient presents with   Hernia    Swelling  Rx for weight management     HPI: Discussed the use of AI scribe software for clinical note transcription with the patient, who gave verbal consent to proceed.  History of Present Illness   Steven Zimmerman is a 63 year old male who presents for evaluation of weight loss and hernia surgery candidacy.  Patient has a recurrent incisional abdominal hernia, which has been evaluated by general surgery.  Last office note was on 03/09/2023 for surgical evaluation.  At that time patient was advised that he needs to be within a 210 to 215 pound weight range before scheduling surgery.  Once he has reached the weight goal, then he will schedule for chemo denervation with BTA injections into the external obliques and surgical intervention.  He has been attempting weight loss with a outpatient bariatric clinic, who had prescribed him phentermine but this did not help with successful weight loss.  Surgical team had discussed initiating Wegovy  to help with successful weight loss in order to proceed with surgery.  At that time last year, patient was told that his insurance would not cover medication.  Wife has since called insurance company who stated they will approve this medication for patient.   He joined a gym last year, and initially lost a significant amount of weight, but upon reassessment, the hernia was found to be more extensive, requiring further weight loss. He has experienced weight fluctuations, losing weight with a weight wellness program but subsequently regaining it.   The following portions of the patient's history were reviewed and updated as appropriate: past medical history, past surgical history,  family history, social history, allergies, medications, and problem list.   Patient Active Problem List   Diagnosis Date Noted   BMI 35.0-35.9,adult 06/22/2023   Prediabetes 06/07/2019   Class 2 obesity due to excess calories with body mass index (BMI) of 37.0 to 37.9 in adult 09/16/2018   Snores 09/16/2018   Alcohol use 09/16/2018   Elevated PSA 02/19/2018   Healthcare maintenance 01/18/2018   Umbilical hernia 07/24/2014   Ventral hernia without obstruction or gangrene 07/24/2014   Erectile dysfunction 05/22/2011   Anxiety    Past Medical History:  Diagnosis Date   Anxiety    Back pain    Cancer (HCC)    skin cancer   Colon polyps 2006   hyperplastic polyps   GERD (gastroesophageal reflux disease)    History of hiatal hernia    umbilical and hiatal   Joint pain    Pneumonia    Viral as a child   Vitamin D  deficiency    Past Surgical History:  Procedure Laterality Date   HERNIA REPAIR  2008   x2 last in 2015   INSERTION OF MESH N/A 01/15/2017   Procedure: INSERTION OF MESH;  Surgeon: Shela Derby, MD;  Location: [facility];  Service: General;  Laterality: N/A;   VASECTOMY  1994   VENTRAL HERNIA REPAIR N/A 01/15/2017   Procedure: LAPRASCOPIC ASSISTED OPEN RECURRENT VENTRAL HERNIA REPAIR WITH MESH;  Surgeon: Shela Derby, MD;  Location: [facility];  Service: General;  Laterality: N/A;   Family History  Problem Relation Age of Onset   Colon cancer Mother    Diabetes Mother    High  blood pressure Mother    Stroke Mother    Depression Mother    Anxiety disorder Mother    Obesity Mother    Esophageal cancer Father    Prostate cancer Neg Hx    Coronary artery disease Neg Hx     Current Outpatient Medications:    meloxicam (MOBIC) 15 MG tablet, Take 15 mg by mouth daily., Disp: , Rfl:    Semaglutide -Weight Management (WEGOVY ) 0.25 MG/0.5ML SOAJ, Inject 0.25mg  once weekly for 4 weeks. Then inject 0.5mg  once weekly., Disp: 2 mL, Rfl: 0   Semaglutide -Weight Management  (WEGOVY ) 0.5 MG/0.5ML SOAJ, Inject 0.5 mg into the skin once a week., Disp: 2 mL, Rfl: 1   amoxicillin -clavulanate (AUGMENTIN ) 875-125 MG tablet, Take 1 tablet by mouth 2 (two) times daily. (Patient not taking: Reported on 03/16/2024), Disp: 20 tablet, Rfl: 0   methylPREDNISolone  (MEDROL  DOSEPAK) 4 MG TBPK tablet, Use per box directions. (Patient not taking: Reported on 03/16/2024), Disp: 21 each, Rfl: 0   oxyCODONE -acetaminophen  (PERCOCET/ROXICET) 5-325 MG tablet, Take 1-2 tablets by mouth every 8 (eight) hours as needed for severe pain (pain score 7-10). (Patient not taking: Reported on 03/16/2024), Disp: 6 tablet, Rfl: 0 No Known Allergies   ROS: A complete ROS was performed with pertinent positives/negatives noted in the HPI. The remainder of the ROS are negative.    Objective:   Today's Vitals   03/16/24 1450  BP: 132/84  Pulse: 85  Temp: 97.7 F (36.5 C)  TempSrc: Temporal  SpO2: 98%  Weight: 281 lb (127.5 kg)  Height: 5\' 9"  (1.753 m)    GENERAL: Well-appearing, in NAD. Well nourished.  SKIN: Pink, warm and dry. No rash, lesion, ulceration, or ecchymoses.  NECK: Trachea midline. Full ROM w/o pain or tenderness. No lymphadenopathy.  RESPIRATORY: Chest wall symmetrical. Respirations even and non-labored. Breath sounds clear to auscultation bilaterally.  CARDIAC: S1, S2 present, regular rate and rhythm. Peripheral pulses 2+ bilaterally.  GI: Abdomen soft. Large , distended abdominal hernia.  EXTREMITIES: Without clubbing, cyanosis, or edema.  NEUROLOGIC: No motor or sensory deficits. Steady, even gait.  PSYCH/MENTAL STATUS: Alert, oriented x 3. Cooperative, appropriate mood and affect.    No results found for any visits on 03/16/24.    Assessment & Plan:  Assessment and Plan    Morbid Obesity Chronic obesity with weight fluctuations. Previous weight loss attempts hindered by insurance issues for medications. - Prescribe Wegovy  0.25 mg once weekly for 4 weeks.  Then increase  to 0.5 mg once weekly and titrate as necessary - Advise high-protein, low-calorie diet emphasizing lean proteins and vegetables.  Avoid sodas and sugary drinks.  Drink plenty of water. - Recommend limiting simple carbohydrates and increasing physical activity to 30-60 minutes daily. - Schedule follow-up appointment in 3 months to monitor weight loss progress.  Hernia Recurrent hernia with significant intestinal protrusion. Surgical intervention contingent upon significant weight loss. - Pursue weight loss to qualify for hernia surgery.  Meds ordered this encounter  Medications   Semaglutide -Weight Management (WEGOVY ) 0.25 MG/0.5ML SOAJ    Sig: Inject 0.25mg  once weekly for 4 weeks. Then inject 0.5mg  once weekly.    Dispense:  2 mL    Refill:  0    Supervising Provider:   THOMPSON, AARON B [[id number]]   Semaglutide -Weight Management (WEGOVY ) 0.5 MG/0.5ML SOAJ    Sig: Inject 0.5 mg into the skin once a week.    Dispense:  2 mL    Refill:  1    Supervising Provider:  Catheryn Cluck [[id number]]   No orders of the defined types were placed in this encounter.  Lab Orders  No laboratory test(s) ordered today   No images are attached to the encounter or orders placed in the encounter.  Return in about 3 months (around 06/15/2024) for Chronic Condition follow up.   Gavin Kast, FNP

## 2024-03-17 ENCOUNTER — Encounter: Payer: Self-pay | Admitting: Internal Medicine

## 2024-03-18 ENCOUNTER — Other Ambulatory Visit (HOSPITAL_COMMUNITY): Payer: Self-pay

## 2024-03-18 ENCOUNTER — Telehealth: Payer: Self-pay

## 2024-03-18 NOTE — Telephone Encounter (Signed)
 Pharmacy Patient Advocate Encounter   Received notification from RX Request Messages that prior authorization for Wegovy  0.25mg /0.74ml is required/requested.   Insurance verification completed.   The patient is insured through [facility] .   Per test claim: PA required; PA started via CoverMyMeds. KEY [id number] . Waiting for clinical questions to populate.

## 2024-03-18 NOTE — Telephone Encounter (Signed)
 Clinical questions answered and PA submitted.

## 2024-03-21 NOTE — Telephone Encounter (Signed)
 Pharmacy Patient Advocate Encounter  Received notification from [facility] that Prior Authorization for Wegovy  0.25mg /0.17ml has been DENIED.  See denial reason below. No denial letter attached in CMM. Will attach denial letter to Media tab once received.   PA #/Case ID/Reference #: [id number]

## 2024-03-22 NOTE — Telephone Encounter (Signed)
 Left VM to rtn call to advise of denial of coverage for the Wegovy .  Dm/cma

## 2024-03-22 NOTE — Telephone Encounter (Signed)
 Patient return call and was inform of denial and would like to know if there is an alternate medication. Please contact patient to advise.

## 2024-03-22 NOTE — Telephone Encounter (Signed)
 Patient notified to call insurance to see if there is anything else that they will cover and then get back to us . .Dm/cma

## 2024-03-23 ENCOUNTER — Other Ambulatory Visit (HOSPITAL_COMMUNITY): Payer: Self-pay

## 2024-03-23 ENCOUNTER — Telehealth: Payer: Self-pay | Admitting: Pharmacy Technician

## 2024-03-23 MED ORDER — TIRZEPATIDE 2.5 MG/0.5ML ~~LOC~~ SOAJ: SUBCUTANEOUS | 0 refills | Status: DC

## 2024-03-23 NOTE — Addendum Note (Signed)
 Addended by: Gavin Kast on: 03/23/2024 11:47 AM   Modules accepted: Orders

## 2024-03-23 NOTE — Telephone Encounter (Signed)

## 2024-06-16 ENCOUNTER — Ambulatory Visit: Admitting: Family Medicine

## 2024-06-16 ENCOUNTER — Encounter: Payer: Self-pay | Admitting: Family Medicine

## 2024-06-16 VITALS — BP 124/72 | HR 79 | Temp 96.9°F | Ht 69.0 in | Wt 265.2 lb

## 2024-06-16 DIAGNOSIS — H9191 Unspecified hearing loss, right ear: Secondary | ICD-10-CM | POA: Diagnosis not present

## 2024-06-16 DIAGNOSIS — R7303 Prediabetes: Secondary | ICD-10-CM

## 2024-06-16 DIAGNOSIS — E66812 Obesity, class 2: Secondary | ICD-10-CM | POA: Diagnosis not present

## 2024-06-16 DIAGNOSIS — K439 Ventral hernia without obstruction or gangrene: Secondary | ICD-10-CM

## 2024-06-16 DIAGNOSIS — R972 Elevated prostate specific antigen [PSA]: Secondary | ICD-10-CM | POA: Diagnosis not present

## 2024-06-16 DIAGNOSIS — Z23 Encounter for immunization: Secondary | ICD-10-CM

## 2024-06-16 DIAGNOSIS — E6609 Other obesity due to excess calories: Secondary | ICD-10-CM

## 2024-06-16 DIAGNOSIS — Z6839 Body mass index (BMI) 39.0-39.9, adult: Secondary | ICD-10-CM

## 2024-06-16 MED ORDER — WEGOVY 1 MG/0.5ML ~~LOC~~ SOAJ: 1.0000 mg | SUBCUTANEOUS | 5 refills | Status: AC

## 2024-06-16 NOTE — Progress Notes (Signed)
 Established Patient Office Visit   Subjective:  Patient ID: Steven Zimmerman, male    DOB: 07/14/1961  Age: 63 y.o. MRN: [id number]  Chief Complaint  Patient presents with   Medical Management of Chronic Issues    3 month follow up. Pt is not fasting.    Hernia    Pt states his abdominal hernia is becoming more painful.    HPI Encounter Diagnoses  Name Primary?   Class 2 obesity due to excess calories without serious comorbidity with body mass index (BMI) of 39.0 to 39.9 in adult Yes   Ventral hernia without obstruction or gangrene    Prediabetes    Elevated PSA    Immunization due    Hearing loss of right ear, unspecified hearing loss type    For follow-up of above.  Last seen in July of this past year and was asked to follow-up in 6 months.  PSA was elevated.  He had been experiencing urinary frequency but that is passed.  He has nocturia x 1.  He believes the force of his stream is okay.  He is frustrated with the medical system and the need to lose weight.  He was seen and prescribed Mounjaro  and has been able to lose some weight with it.  It is expensive for him.  He denies any episodes of severe abdominal pain.  He has had some nausea with it.  He feels as though his tinnitus in his right ear has worsened since he started it.  Losing weight is important before he can have the surgery to correct his hernia.  Recently received correspondence from GI to return for screening.  Mom had colon cancer and dad had esophageal cancer.   Review of Systems  Constitutional: Negative.   HENT:  Positive for hearing loss.   Eyes:  Negative for blurred vision, discharge and redness.  Respiratory: Negative.    Cardiovascular: Negative.   Gastrointestinal:  Negative for abdominal pain.  Genitourinary: Negative.   Musculoskeletal: Negative.  Negative for myalgias.  Skin:  Negative for rash.  Neurological:  Negative for tingling, loss of consciousness and weakness.  Endo/Heme/Allergies:   Negative for polydipsia.     Current Outpatient Medications:    Semaglutide -Weight Management (WEGOVY ) 1 MG/0.5ML SOAJ, Inject 1 mg into the skin once a week., Disp: 2 mL, Rfl: 5   meloxicam (MOBIC) 15 MG tablet, Take 15 mg by mouth daily., Disp: , Rfl:    Objective:     BP 124/72 (Cuff Size: Large)   Pulse 79   Temp (!) 96.9 F (36.1 C) (Temporal)   Ht 5' 9 (1.753 m)   Wt 265 lb 3.2 oz (120.3 kg)   SpO2 96%   BMI 39.16 kg/m  Wt Readings from Last 3 Encounters:  06/16/24 265 lb 3.2 oz (120.3 kg)  03/16/24 281 lb (127.5 kg)  10/20/23 241 lb 6.5 oz (109.5 kg)      Physical Exam Constitutional:      General: He is not in acute distress.    Appearance: Normal appearance. He is not ill-appearing, toxic-appearing or diaphoretic.  HENT:     Head: Normocephalic and atraumatic.     Right Ear: Tympanic membrane, ear canal and external ear normal.     Left Ear: Tympanic membrane, ear canal and external ear normal.     Mouth/Throat:     Mouth: Mucous membranes are moist.     Pharynx: Oropharynx is clear. No oropharyngeal exudate or posterior oropharyngeal erythema.  Eyes:     General: No scleral icterus.       Right eye: No discharge.        Left eye: No discharge.     Extraocular Movements: Extraocular movements intact.     Conjunctiva/sclera: Conjunctivae normal.     Pupils: Pupils are equal, round, and reactive to light.  Cardiovascular:     Rate and Rhythm: Normal rate and regular rhythm.  Pulmonary:     Effort: Pulmonary effort is normal. No respiratory distress.     Breath sounds: Normal breath sounds. No wheezing or rales.  Abdominal:     General: Bowel sounds are normal.     Hernia: A hernia is present. Hernia is present in the ventral area (Large firm irreducible ventral hernia.).  Musculoskeletal:     Cervical back: No rigidity or tenderness.  Skin:    General: Skin is warm and dry.  Neurological:     Mental Status: He is alert and oriented to person, place,  and time.  Psychiatric:        Mood and Affect: Mood normal.        Behavior: Behavior normal.      No results found for any visits on 06/16/24.    The ASCVD Risk score (Arnett DK, et al., 2019) failed to calculate for the following reasons:   Cannot find a previous HDL lab   Cannot find a previous total cholesterol lab    Assessment & Plan:   Class 2 obesity due to excess calories without serious comorbidity with body mass index (BMI) of 39.0 to 39.9 in adult -     Amb Ref to Medical Weight Management -     Wegovy ; Inject 1 mg into the skin once a week.  Dispense: 2 mL; Refill: 5  Ventral hernia without obstruction or gangrene  Prediabetes -     Basic metabolic panel with GFR; Future -     Hemoglobin A1c; Future  Elevated PSA -     PSA; Future  Immunization due -     Tdap vaccine greater than or equal to 7yo IM  Hearing loss of right ear, unspecified hearing loss type    Return in about 3 months (around 09/16/2024), or Will return for blood work..  And increased Wegovy  to 1 mg weekly.  Referred back to weight loss management.  Did not see where Wegovy  is associated with tinnitus.  Rechecking PSA and A1c.  Tdap.  Declines referral to audiology.  Says that work has been checking his hearing and he understands that he has hearing loss in his right ear.  I advised that this is likely the cause of his tinnitus.  I understand that he is frustrated.  Elsie Sim Lent, MD

## 2024-10-09 DIAGNOSIS — M65962 Unspecified synovitis and tenosynovitis, left lower leg: Secondary | ICD-10-CM | POA: Diagnosis not present

## 2024-10-09 DIAGNOSIS — M25562 Pain in left knee: Secondary | ICD-10-CM | POA: Diagnosis not present

## 2024-10-22 DIAGNOSIS — M25562 Pain in left knee: Secondary | ICD-10-CM | POA: Diagnosis not present

## 2024-10-26 DIAGNOSIS — M2550 Pain in unspecified joint: Secondary | ICD-10-CM | POA: Diagnosis not present

## 2024-10-26 DIAGNOSIS — M25562 Pain in left knee: Secondary | ICD-10-CM | POA: Diagnosis not present

## 2024-10-26 DIAGNOSIS — E559 Vitamin D deficiency, unspecified: Secondary | ICD-10-CM | POA: Diagnosis not present

## 2024-10-27 ENCOUNTER — Other Ambulatory Visit (HOSPITAL_COMMUNITY): Payer: Self-pay | Admitting: Sports Medicine

## 2024-10-27 ENCOUNTER — Ambulatory Visit (HOSPITAL_COMMUNITY): Admission: RE | Admit: 2024-10-27 | Discharge: 2024-10-27 | Attending: Vascular Surgery

## 2024-10-27 DIAGNOSIS — M79662 Pain in left lower leg: Secondary | ICD-10-CM | POA: Insufficient documentation

## 2024-11-02 DIAGNOSIS — Z713 Dietary counseling and surveillance: Secondary | ICD-10-CM | POA: Diagnosis not present

## 2024-11-02 DIAGNOSIS — M1712 Unilateral primary osteoarthritis, left knee: Secondary | ICD-10-CM | POA: Diagnosis not present

## 2024-12-16 ENCOUNTER — Telehealth: Payer: Self-pay | Admitting: Family Medicine

## 2024-12-16 NOTE — Telephone Encounter (Signed)
 Form received and will be placed on providers dek to be sign upon return to clinic on ? Monday or Tuesday.   He will come in on 12/20/24 @ 10:40 am. Dm/cma

## 2024-12-16 NOTE — Telephone Encounter (Signed)
 Copied from CRM [id number]. Topic: General - Call Back - No Documentation >> Dec 16, 2024  1:52 PM Alfonso ORN wrote: Reason for CRM: 12/10 medical clearance requested not yet  dr. Fonda landow's office called to f/u on 12/10 medical clearance requested not yet received for upcoming surgery, requesting clearance be submitted to   as soon as possible as pt surgery would be cancelled by Tuesday if not received. office will refax request fax: [phone] Please callback: Maeola [phone] to confirm receipt of fax

## 2024-12-20 ENCOUNTER — Ambulatory Visit: Admitting: Family Medicine

## 2024-12-20 NOTE — Telephone Encounter (Signed)
 Pt did not call back. If he calls after 5p, please schedule with another provider at [facility] with the earliest appt available.   LVM for Joen that I did not hear back from the pt.

## 2024-12-20 NOTE — Telephone Encounter (Signed)
 LVM for pt to call me directly for earlier appt. Please transfer.  Received call from [facility] with Dr. Lindalou office stating the surgery center is trying to cancel pts appt Thursday because the form has been requested since 12/10. Advised I will reach out to pt for earlier appt 1/28.

## 2024-12-21 ENCOUNTER — Telehealth: Payer: Self-pay | Admitting: Family Medicine

## 2024-12-21 ENCOUNTER — Ambulatory Visit: Admitting: Family Medicine

## 2024-12-21 VITALS — BP 146/99 | HR 98 | Temp 98.1°F | Ht 69.0 in | Wt 276.8 lb

## 2024-12-21 DIAGNOSIS — Z01818 Encounter for other preprocedural examination: Secondary | ICD-10-CM | POA: Diagnosis not present

## 2024-12-21 DIAGNOSIS — M1712 Unilateral primary osteoarthritis, left knee: Secondary | ICD-10-CM | POA: Diagnosis not present

## 2024-12-21 DIAGNOSIS — R7303 Prediabetes: Secondary | ICD-10-CM | POA: Diagnosis not present

## 2024-12-21 DIAGNOSIS — I10 Essential (primary) hypertension: Secondary | ICD-10-CM | POA: Insufficient documentation

## 2024-12-21 LAB — CBC WITH DIFFERENTIAL/PLATELET
Basophils Absolute: 0.1 10*3/uL (ref 0.0–0.1)
Basophils Relative: 0.6 % (ref 0.0–3.0)
Eosinophils Absolute: 0.2 10*3/uL (ref 0.0–0.7)
Eosinophils Relative: 2 % (ref 0.0–5.0)
HCT: 44.9 % (ref 39.0–52.0)
Hemoglobin: 15.1 g/dL (ref 13.0–17.0)
Lymphocytes Relative: 21.7 % (ref 12.0–46.0)
Lymphs Abs: 1.9 10*3/uL (ref 0.7–4.0)
MCHC: 33.6 g/dL (ref 30.0–36.0)
MCV: 87.3 fl (ref 78.0–100.0)
Monocytes Absolute: 0.6 10*3/uL (ref 0.1–1.0)
Monocytes Relative: 7.1 % (ref 3.0–12.0)
Neutro Abs: 6.2 10*3/uL (ref 1.4–7.7)
Neutrophils Relative %: 68.6 % (ref 43.0–77.0)
Platelets: 205 10*3/uL (ref 150.0–400.0)
RBC: 5.14 Mil/uL (ref 4.22–5.81)
RDW: 14.6 % (ref 11.5–15.5)
WBC: 9 10*3/uL (ref 4.0–10.5)

## 2024-12-21 LAB — LIPID PANEL
Cholesterol: 180 mg/dL (ref 28–200)
HDL: 54.6 mg/dL
LDL Cholesterol: 108 mg/dL — ABNORMAL HIGH (ref 10–99)
NonHDL: 125.51
Total CHOL/HDL Ratio: 3
Triglycerides: 89 mg/dL (ref 10.0–149.0)
VLDL: 17.8 mg/dL (ref 0.0–40.0)

## 2024-12-21 LAB — HEMOGLOBIN A1C: Hgb A1c MFr Bld: 6 % (ref 4.6–6.5)

## 2024-12-21 LAB — COMPREHENSIVE METABOLIC PANEL WITH GFR
ALT: 19 U/L (ref 3–53)
AST: 19 U/L (ref 5–37)
Albumin: 4.1 g/dL (ref 3.5–5.2)
Alkaline Phosphatase: 58 U/L (ref 39–117)
BUN: 19 mg/dL (ref 6–23)
CO2: 26 meq/L (ref 19–32)
Calcium: 9.4 mg/dL (ref 8.4–10.5)
Chloride: 103 meq/L (ref 96–112)
Creatinine, Ser: 0.75 mg/dL (ref 0.40–1.50)
GFR: 96.68 mL/min
Glucose, Bld: 90 mg/dL (ref 70–99)
Potassium: 3.9 meq/L (ref 3.5–5.1)
Sodium: 137 meq/L (ref 135–145)
Total Bilirubin: 0.5 mg/dL (ref 0.2–1.2)
Total Protein: 7.5 g/dL (ref 6.0–8.3)

## 2024-12-21 LAB — TSH: TSH: 2.39 u[IU]/mL (ref 0.35–5.50)

## 2024-12-21 NOTE — Telephone Encounter (Signed)
 Terrill left another msg for the pt to call back for earlier appt.

## 2024-12-21 NOTE — Telephone Encounter (Signed)
 Call received from Sherri at [facility] that they already have labs for this pt collected 10/25/24 and they will check BP there in the office the morning of surgery.  Practice admin contacted Dr. Sebastian who pt had the Sx Clearance OV with today to inquire if form could be completed pending the above conditions. See 12/21/24 Advise Only Encounter. Dr. Sebastian does not feel comfortable.   1 week BP FU scheduled with RONAL Senters on 12/26/24. Labs have been sent to [facility], and pt advised to arrive to appt early to allow ample time to relax prior to having BP taken, also to send home BP readings via MyChart.  Sherri at [employer]. States pt won't be scheduled until end of February.

## 2024-12-21 NOTE — Telephone Encounter (Signed)
 Surgical Clearance form given to Vita Cleveland, CMA for OV with Sebastian today 12/21/24 at 1:20.

## 2024-12-21 NOTE — Telephone Encounter (Signed)
 Pts original appt was cx due to weather. His PCP was not returning to the office until 1/29 so in an effort to coordinate his care he was scheduled with another provider in the office. Since the provider he was scheduled with was unfamiliar with pt, he land. The surgical center did not need any labs as they had recent results.They just needed his blood pressure and a signature. (If he was cleared) If they dont have this by end of day today with a signature on it, theyre going to cancel this mans surgery tomorrow.  Provider had left the office for the day and I reached via phone. Providers response was I would like to help. But Im not the PCP. He was just added to my schedule last minute. His Blood pressure was elevated in the office, even on recheck. He havent had any lab work was done in over a year. Hes morbidly obese. I dont feel comfortable signing off on that without a proper clearance   We support providers decision making and will defer to PCP on his return, 12/22/24. Surgery center will fax over recent lab results and give to RN.

## 2024-12-21 NOTE — Patient Instructions (Signed)
 It was very nice to see you today!  VISIT SUMMARY: Today, you came in for a preoperative clearance for your left knee replacement surgery. We discussed your new onset hypertension, morbid obesity, prediabetes, and history of lower extremity edema. Your surgery has been delayed until we review your blood work results and stabilize your blood pressure.  YOUR PLAN: PREOPERATIVE CLEARANCE FOR LEFT KNEE REPLACEMENT: You need clearance for your upcoming left knee replacement surgery. Your blood pressure is elevated today, likely due to anxiety and stress related to the surgical process. -We have ordered blood work including CMP, CBC, fasting lipid panel, hemoglobin A1c, fasting serum glucose, TSH, and urinalysis. -Monitor your blood pressure at home and report the readings to us . -Your surgery has been delayed until we review your blood work results and stabilize your blood pressure.  PRIMARY HYPERTENSION: You have new onset hypertension, likely exacerbated by anxiety and stress related to your upcoming surgery. -Monitor your blood pressure at home and report the readings to us . -We will consider a virtual follow-up to review your blood pressure readings.  MORBID OBESITY: You have a BMI of 40. Lifestyle modifications are recommended to manage your weight and associated health risks. -Follow a healthy diet and exercise regularly.  PREDIABETES: You have a history of prediabetes. -We have ordered hemoglobin A1c and fasting serum glucose to screen for diabetes.  No follow-ups on file.   Take care, Arvella Hummer, MD, MS   PLEASE NOTE:  If you had any lab tests, please let us  know if you have not heard back within a few days. You may see your results on mychart before we have a chance to review them but we will give you a call once they are reviewed by us .   If we ordered any referrals today, please let us  know if you have not heard from their office within the next week.   If you had any urgent  prescriptions sent in today, please check with the pharmacy within an hour of our visit to make sure the prescription was transmitted appropriately.   Please try these tips to maintain a healthy lifestyle:  Eat at least 3 REAL meals and 1-2 snacks per day.  Aim for no more than 5 hours between eating.  If you eat breakfast, please do so within one hour of getting up.   Each meal should contain half fruits/vegetables, one quarter protein, and one quarter carbs (no bigger than a computer mouse)  Cut down on sweet beverages. This includes juice, soda, and sweet tea.   Drink at least 1 glass of water with each meal and aim for at least 8 glasses per day  Exercise at least 150 minutes every week.

## 2024-12-21 NOTE — Telephone Encounter (Signed)
 LVM for Steven Zimmerman to call the CAL. Asking if pt can complete OV for Sx Clearance then have it turned it. Pt has been moved from 3:40 to 1:20.

## 2024-12-21 NOTE — Progress Notes (Signed)
 " Assessment & Plan   Assessment/Plan:    Assessment & Plan Preoperative clearance for left knee replacement Preoperative clearance is required for upcoming left knee replacement surgery. EKG shows normal sinus rhythm with no arrhythmias. Blood work is pending to assess for potential complications such as cardiovascular issues. Elevated blood pressure today, likely due to anxiety and stress related to the surgical process. Surgery cannot proceed until blood work results are reviewed and blood pressure is stabilized. - Ordered blood work including CMP, CBC, fasting lipid panel, hemoglobin A1c, fasting serum glucose, TSH, and urinalysis. - Advised home blood pressure monitoring and to report readings. - Delayed surgery until blood work results are reviewed and blood pressure is stabilized.  Primary hypertension New onset hypertension, likely exacerbated by anxiety and stress related to upcoming surgery. Blood pressure is elevated today, but he reports normal readings at home. - Advised home blood pressure monitoring and to report readings. - Will consider virtual follow-up to review blood pressure readings.  Morbid obesity BMI of 40. Lifestyle modifications recommended to manage weight and associated health risks. - Recommended lifestyle modifications including healthy diet and exercise.  Prediabetes Screening for diabetes is necessary due to prediabetes. - Ordered hemoglobin A1c and fasting serum glucose.        There are no discontinued medications.  Return in about 1 week (around 12/28/2024) for BP.        Subjective:   Encounter date: 12/21/2024  Steven Zimmerman is a 64 y.o. male who has Anxiety; Erectile dysfunction; Umbilical hernia; Ventral hernia without obstruction or gangrene; Healthcare maintenance; Elevated PSA; Zimmerman 2 obesity due to excess calories with body mass index (BMI) of 37.0 to 37.9 in adult; Snores; Alcohol use; Prediabetes; BMI 35.0-35.9,adult; Primary  hypertension; and Morbid obesity (HCC) on their problem list..   He  has a past medical history of Anxiety, Back pain, Cancer (HCC), Colon polyps (2006), GERD (gastroesophageal reflux disease), History of hiatal hernia, Joint pain, Pneumonia, and Vitamin D  deficiency.SABRA   He presents with chief complaint of Surgical Clearance  (Pt presents to be cleared fo surgery today ) .   Discussed the use of AI scribe software for clinical note transcription with the patient, who gave verbal consent to proceed.  History of Present Illness Steven Zimmerman is a 64 year old male with morbid obesity and new onset hypertension who presents for preoperative clearance for left knee replacement surgery.  Preoperative evaluation for left knee replacement - Scheduled for left knee replacement surgery tomorrow morning. - Delays in obtaining necessary preoperative evaluations have caused significant anxiety and elevated blood pressure. - Significant frustration with the healthcare process, including delays and communication issues, contributing to anxiety and elevated blood pressure.  Hypertension - New onset hypertension noted during preoperative evaluation. - Blood pressure is usually normal at home, with current elevation attributed to stress and anxiety related to surgery preparation. - No history of high blood pressure at home; monitors blood pressure regularly. - No chest pain or shortness of breath.  Morbid obesity - Morbid obesity with BMI of 40. - Attempts at weight loss, including paying out of pocket for weight loss programs. - Previous success in losing weight, but with fluctuations. - History of multiple hernia surgeries, with last surgery resulting in complications and requiring follow-up at [facility] for weight management.  Prediabetes - History of prediabetes. - Previously on govitecan, which has not been taken for several months.  Lower extremity edema - History of leg swelling. - Ultrasound  performed  to rule out blood clot; results negative. - Received cortisone injection and steroids for swelling.     ROS  Past Surgical History:  Procedure Laterality Date   HERNIA REPAIR  2008   x2 last in 2015   INSERTION OF MESH N/A 01/15/2017   Procedure: INSERTION OF MESH;  Surgeon: Lynda Leos, MD;  Location: [facility];  Service: General;  Laterality: N/A;   VASECTOMY  1994   VENTRAL HERNIA REPAIR N/A 01/15/2017   Procedure: LAPRASCOPIC ASSISTED OPEN RECURRENT VENTRAL HERNIA REPAIR WITH MESH;  Surgeon: Lynda Leos, MD;  Location: [facility];  Service: General;  Laterality: N/A;    Outpatient Medications Prior to Visit  Medication Sig Dispense Refill   meloxicam (MOBIC) 15 MG tablet Take 15 mg by mouth daily. (Patient not taking: Reported on 12/21/2024)     Semaglutide -Weight Management (WEGOVY ) 1 MG/0.5ML SOAJ Inject 1 mg into the skin once a week. (Patient not taking: Reported on 12/21/2024) 2 mL 5   No facility-administered medications prior to visit.    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    Employer: [employer]  Tobacco Use   Smoking status: Former    Current packs/day: 0.00    Types: Cigarettes    Quit date: 02/23/2021    Years since quitting: 3.8   Smokeless tobacco: Never  Substance and Sexual Activity   Alcohol use: Yes    Comment: occ mixed drink   Drug use: No   Sexual activity: Yes  Other Topics Concern   Not on file  Social History Narrative   Diet: regular,  problem w/ portion control-----exercise: nothing regular    Social Drivers of Health   Tobacco Use: Medium Risk (06/16/2024)   Patient History    Smoking Tobacco Use: Former    Smokeless Tobacco Use: Never    Passive Exposure: Not on Actuary Strain: Not on file  Food Insecurity: Not on file  Transportation Needs: Not on file  Physical Activity: Not on file  Stress: Not on file  Social Connections: Not on file  Intimate Partner Violence: Not on file  Depression (PHQ2-9): Low Risk (12/21/2024)   Depression (PHQ2-9)    PHQ-2 Score: 0  Alcohol Screen: Not on file  Housing: Not on file  Utilities: Not on file  Health Literacy: Not on file  Objective:  Physical Exam: BP (!) 146/99   Pulse 98   Temp 98.1 F (36.7 C)   Ht 5' 9 (1.753 m)   Wt 276 lb 12.8 oz (125.6 kg)   SpO2 96%   BMI 40.88 kg/m    Physical Exam MEASUREMENTS: BMI- 40.0. GENERAL: Alert, cooperative, well developed, no acute distress HEENT: Normocephalic, normal oropharynx, moist mucous membranes CHEST: Clear to auscultation bilaterally, no wheezes, rhonchi, or crackles CARDIOVASCULAR: Normal heart rate and rhythm, S1 and S2 normal without murmurs ABDOMEN: Soft, non-tender, ventral wall hernia, without organomegaly, normal bowel sounds EXTREMITIES: No cyanosis or edema NEUROLOGICAL: Cranial nerves grossly intact, moves all extremities without gross motor or sensory deficit   Physical Exam  VAS US  LOWER EXTREMITY VENOUS (DVT) Result Date: 10/27/2024  Lower Venous DVT Study Patient Name:  Steven Zimmerman  Date of Exam:   10/27/2024 Medical Rec #: [id number]         Accession #:    [id number] Date of Birth: 19-Aug-1961          Patient Gender: M Patient Age:   69 years Exam Location:  [address] Procedure:      VAS US  LOWER EXTREMITY VENOUS (DVT) Referring Phys: REBECCA BASSETT  --------------------------------------------------------------------------------  Indications: Pain, and Swelling.  Risk Factors: Obesity. Limitations: Body habitus. Performing Technologist: Stoney Ross RVT  Examination Guidelines: A complete evaluation includes B-mode imaging, spectral Doppler, color Doppler, and power Doppler as needed of all accessible portions of each vessel. Bilateral testing is considered an integral part of a complete examination. Limited examinations for reoccurring indications may be performed as noted. The reflux portion of the exam is performed with the patient in reverse Trendelenburg.  +-----+---------------+---------+-----------+----------+--------------+ RIGHTCompressibilityPhasicitySpontaneityPropertiesThrombus Aging +-----+---------------+---------+-----------+----------+--------------+ CFV  Full           Yes      Yes                                 +-----+---------------+---------+-----------+----------+--------------+ SFJ  Full                    Yes                                 +-----+---------------+---------+-----------+----------+--------------+  +---------+---------------+---------+-----------+----------+--------------+ LEFT     CompressibilityPhasicitySpontaneityPropertiesThrombus Aging +---------+---------------+---------+-----------+----------+--------------+ CFV      Full           Yes      Yes                                 +---------+---------------+---------+-----------+----------+--------------+ SFJ      Full                    Yes                                 +---------+---------------+---------+-----------+----------+--------------+ FV Prox  Full           Yes      Yes                                 +---------+---------------+---------+-----------+----------+--------------+ FV Mid   Full  Yes      Yes                                  +---------+---------------+---------+-----------+----------+--------------+ FV DistalFull           Yes      Yes                                 +---------+---------------+---------+-----------+----------+--------------+ PFV      Full                    Yes                                 +---------+---------------+---------+-----------+----------+--------------+ POP      Full           Yes      Yes                                 +---------+---------------+---------+-----------+----------+--------------+ PTV      Full                    Yes                                 +---------+---------------+---------+-----------+----------+--------------+ PERO     Full                    Yes                                 +---------+---------------+---------+-----------+----------+--------------+ GSV      Full                    Yes                                 +---------+---------------+---------+-----------+----------+--------------+  Summary: RIGHT: - No evidence of common femoral vein obstruction.  LEFT: - There is no evidence of deep vein thrombosis in the lower extremity. - There is no evidence of superficial venous thrombosis.  - No cystic structure found in the popliteal fossa. - Left peroneal veins not easily visualized.  *See table(s) above for measurements and observations. Electronically signed by Debby Robertson on 10/27/2024 at 8:06:58 PM.    Final     No results found for this or any previous visit (from the past 2160 hours).      Beverley Adine Hummer, MD, MS "

## 2024-12-22 ENCOUNTER — Ambulatory Visit: Payer: Self-pay | Admitting: Family Medicine

## 2024-12-22 DIAGNOSIS — R7303 Prediabetes: Secondary | ICD-10-CM

## 2024-12-22 DIAGNOSIS — E7800 Pure hypercholesterolemia, unspecified: Secondary | ICD-10-CM

## 2024-12-22 LAB — URINALYSIS W MICROSCOPIC + REFLEX CULTURE
Bacteria, UA: NONE SEEN /HPF
Bilirubin Urine: NEGATIVE
Glucose, UA: NEGATIVE
Hgb urine dipstick: NEGATIVE
Hyaline Cast: NONE SEEN /LPF
Ketones, ur: NEGATIVE
Leukocyte Esterase: NEGATIVE
Nitrites, Initial: NEGATIVE
Protein, ur: NEGATIVE
RBC / HPF: NONE SEEN /HPF (ref 0–2)
Specific Gravity, Urine: 1.007 (ref 1.001–1.035)
Squamous Epithelial / HPF: NONE SEEN /HPF
WBC, UA: NONE SEEN /HPF (ref 0–5)
pH: 5.5 (ref 5.0–8.0)

## 2024-12-22 LAB — MICROALBUMIN / CREATININE URINE RATIO
Creatinine,U: 27.9 mg/dL
Microalb Creat Ratio: UNDETERMINED mg/g (ref 0.0–30.0)
Microalb, Ur: <0.7 mg/dL

## 2024-12-22 LAB — NO CULTURE INDICATED

## 2024-12-22 MED ORDER — METFORMIN HCL ER 500 MG PO TB24: ORAL_TABLET | ORAL | 3 refills | Status: AC

## 2024-12-22 MED ORDER — ROSUVASTATIN CALCIUM 20 MG PO TABS: 20.0000 mg | ORAL_TABLET | Freq: Every day | ORAL | 3 refills | Status: AC

## 2024-12-22 NOTE — Progress Notes (Signed)
 Last read by Ozell LELON Elbe at 9:38AM on 12/22/2024.

## 2024-12-26 ENCOUNTER — Ambulatory Visit: Admitting: Internal Medicine

## 2024-12-27 ENCOUNTER — Encounter: Payer: Self-pay | Admitting: Internal Medicine

## 2024-12-27 ENCOUNTER — Ambulatory Visit: Admitting: Internal Medicine

## 2024-12-27 VITALS — BP 130/90 | HR 91 | Temp 98.2°F | Ht 70.0 in | Wt 277.4 lb

## 2024-12-27 DIAGNOSIS — Z01818 Encounter for other preprocedural examination: Secondary | ICD-10-CM

## 2025-06-27 ENCOUNTER — Encounter: Admitting: Internal Medicine
